# Patient Record
Sex: Female | Born: 1975 | Race: Black or African American | Hispanic: No
Health system: Midwestern US, Academic
[De-identification: ages and names within clinical notes are randomized; demographics above are authoritative.]

---

## 2016-12-25 ENCOUNTER — Emergency Department: Admit: 2016-12-25 | Discharge: 2016-12-25 | Disposition: A | Discharge status: Home / Self Care

## 2016-12-25 ENCOUNTER — Emergency Department: Admit: 2016-12-25 | Discharge: 2016-12-25 | Payer: Vision Other Private Insurance

## 2016-12-25 DIAGNOSIS — R109 Unspecified abdominal pain: ICD-10-CM

## 2016-12-25 DIAGNOSIS — R1084 Generalized abdominal pain: Principal | ICD-10-CM

## 2016-12-25 DIAGNOSIS — R112 Nausea with vomiting, unspecified: ICD-10-CM

## 2016-12-25 LAB — URINALYSIS DIPSTICK
Lab: NEGATIVE
Lab: NEGATIVE
Lab: NEGATIVE
Lab: NEGATIVE
Lab: NEGATIVE

## 2016-12-25 LAB — URINALYSIS, MICROSCOPIC

## 2016-12-25 LAB — COMPREHENSIVE METABOLIC PANEL
Lab: 0.3 mg/dL (ref 0.3–1.2)
Lab: 0.6 mg/dL (ref 0.4–1.00)
Lab: 133 MMOL/L — ABNORMAL LOW (ref 137–147)
Lab: 23 U/L (ref 7–40)
Lab: 25 MMOL/L (ref 21–30)
Lab: 3.1 MMOL/L — ABNORMAL LOW (ref 3.5–5.1)
Lab: 4 g/dL — ABNORMAL LOW (ref 3.5–5.0)
Lab: 60 U/L — ABNORMAL HIGH (ref 25–110)
Lab: 7 mg/dL (ref 7–25)
Lab: 8 g/dL (ref 6.0–8.0)
Lab: 88 mg/dL (ref 70–100)
Lab: 9 K/UL (ref 3–12)
Lab: 9.3 mg/dL (ref 8.5–10.6)
Lab: 99 MMOL/L — ABNORMAL LOW (ref 98–110)
Lab: >60 mL/min (ref >60)
Lab: >60 mL/min (ref >60)

## 2016-12-25 LAB — TROPONIN-I

## 2016-12-25 LAB — CBC AND DIFF
Lab: 0 10*3/uL (ref 0–0.20)
Lab: 0.1 10*3/uL (ref 0–0.45)
Lab: 6.7 10*3/uL (ref 4.5–11.0)

## 2016-12-25 LAB — POC LACTATE: Lab: 1.7 MMOL/L (ref 0.5–2.0)

## 2016-12-25 LAB — LIPASE: Lab: 27 U/L (ref 11–82)

## 2016-12-25 MED ORDER — ONDANSETRON 4 MG PO TBDI
4 mg | ORAL_TABLET | ORAL | 0 refills | 8.00000 days | Status: AC | PRN
Start: 2016-12-25 — End: 2017-04-26

## 2016-12-25 MED ORDER — ONDANSETRON HCL (PF) 4 MG/2 ML IJ SOLN
4 mg | Freq: Once | INTRAVENOUS | 0 refills | Status: CP
Start: 2016-12-25 — End: ?

## 2016-12-25 MED ORDER — LACTATED RINGERS IV SOLP
1000 mL | INTRAVENOUS | 0 refills | Status: CP
Start: 2016-12-25 — End: ?
  Administered 2016-12-25: 20:00:00 1000 mL via INTRAVENOUS

## 2016-12-25 NOTE — Response Teams
Rapid Response Team Progress Note    Date: 12/25/2016 Time: 1:21 PM  Patient: Cindy Snyder  Attending: No att. providers found Service: Emergency Medicine  Admission Date: 12/25/2016  LOS: 0 days    A Code/Rapid Response Timeline Event Report has been created for this patient on 12/25/16 at 1320. RRT called when patient had a sudden onset of abdominal pain and nausea. BP hypertensive at 173/110 otherwise VSS. While in route to emergency room patient began vomiting. Patient taken via wheelchair to ED 35 for further management.       Anthonette Legato, RN

## 2016-12-25 NOTE — ED Notes
Pt brought to the ED by RR team.  Pt was at work in [employer] when she experienced sudden onset of periumbilical abdominal pain 10/10, nausea, and shortly thereafter vomiting (approximately 468ml). On arrival to ED pt states that symptoms had improved significantly. Pt states that she had side pain yesterday and took a muscle relaxer. Denies alterations in I/O, fever, bleeding.    Belongings include ID, wallet with cards, staff shirt, pants, glasses. Belongings remain with pt. Pt does not wish to store belongings at this time.

## 2016-12-25 NOTE — ED Notes
Pt verbalizes understanding of discharge instructions and has no further questions.

## 2016-12-31 ENCOUNTER — Emergency Department: Admit: 2016-12-31 | Discharge: 2016-12-31 | Discharge status: Against Medical Advice

## 2017-04-26 ENCOUNTER — Encounter
Admit: 2017-04-26 | Discharge: 2017-04-26 | Payer: Vision Other Private Insurance | Primary: Student in an Organized Health Care Education/Training Program

## 2017-04-26 ENCOUNTER — Ambulatory Visit
Admit: 2017-04-26 | Discharge: 2017-04-26 | Payer: Commercial Managed Care - HMO | Primary: Student in an Organized Health Care Education/Training Program

## 2017-04-26 DIAGNOSIS — D219 Benign neoplasm of connective and other soft tissue, unspecified: ICD-10-CM

## 2017-04-26 DIAGNOSIS — N83201 Unspecified ovarian cyst, right side: ICD-10-CM

## 2017-04-26 DIAGNOSIS — N83209 Unspecified ovarian cyst, unspecified side: ICD-10-CM

## 2017-04-26 DIAGNOSIS — K5901 Slow transit constipation: ICD-10-CM

## 2017-04-26 DIAGNOSIS — R6889 Other general symptoms and signs: Principal | ICD-10-CM

## 2017-04-26 DIAGNOSIS — D649 Anemia, unspecified: ICD-10-CM

## 2017-04-26 DIAGNOSIS — Z7689 Persons encountering health services in other specified circumstances: Principal | ICD-10-CM

## 2017-04-26 LAB — CBC AND DIFF
Lab: 0 % (ref >60)
Lab: 0 10*3/uL (ref 0–0.20)
Lab: 0.1 10*3/uL (ref 0–0.45)
Lab: 0.7 10*3/uL (ref 0–0.80)
Lab: 10 g/dL — ABNORMAL LOW (ref 12.0–15.0)
Lab: 13 % — ABNORMAL HIGH (ref 4–12)
Lab: 16 % — ABNORMAL HIGH (ref 11–15)
Lab: 2 % (ref >60)
Lab: 2.2 10*3/uL (ref 1.8–7.0)
Lab: 2.6 10*3/uL (ref 1.0–4.8)
Lab: 24 pg — ABNORMAL LOW (ref 26–34)
Lab: 267 K/UL (ref 150–400)
Lab: 31 g/dL — ABNORMAL LOW (ref 32.0–36.0)
Lab: 33 % — ABNORMAL LOW (ref 36–45)
Lab: 38 % — ABNORMAL LOW (ref 41–77)
Lab: 4.2 M/UL (ref 4.0–5.0)
Lab: 47 % — ABNORMAL HIGH (ref 24–44)
Lab: 5.7 10*3/uL (ref 4.5–11.0)
Lab: 7.8 FL (ref 7–11)
Lab: 77 FL — ABNORMAL LOW (ref 80–100)

## 2017-04-26 LAB — COMPREHENSIVE METABOLIC PANEL
Lab: 136 MMOL/L — ABNORMAL LOW (ref >40)
Lab: 3.8 MMOL/L — ABNORMAL HIGH (ref <100)

## 2017-04-26 LAB — TSH WITH FREE T4 REFLEX: Lab: 1.3 uU/mL (ref 0.35–5.00)

## 2017-04-26 LAB — LIPID PROFILE
Lab: 140 mg/dL (ref <150)
Lab: 235 mg/dL — ABNORMAL HIGH (ref <200)

## 2017-04-26 MED ORDER — SENNOSIDES-DOCUSATE SODIUM 8.6-50 MG PO TAB
1 | ORAL_TABLET | Freq: Every day | ORAL | 0 refills | Status: AC
Start: 2017-04-26 — End: 2017-06-09

## 2017-05-19 ENCOUNTER — Ambulatory Visit
Admit: 2017-05-19 | Discharge: 2017-05-19 | Payer: Commercial Managed Care - HMO | Primary: Student in an Organized Health Care Education/Training Program

## 2017-05-19 ENCOUNTER — Encounter
Admit: 2017-05-19 | Discharge: 2017-05-19 | Payer: Vision Other Private Insurance | Primary: Student in an Organized Health Care Education/Training Program

## 2017-05-19 ENCOUNTER — Encounter
Admit: 2017-05-19 | Discharge: 2017-05-19 | Payer: Commercial Managed Care - HMO | Primary: Student in an Organized Health Care Education/Training Program

## 2017-05-19 DIAGNOSIS — D219 Benign neoplasm of connective and other soft tissue, unspecified: ICD-10-CM

## 2017-05-19 DIAGNOSIS — N898 Other specified noninflammatory disorders of vagina: ICD-10-CM

## 2017-05-19 DIAGNOSIS — Z30013 Encounter for initial prescription of injectable contraceptive: ICD-10-CM

## 2017-05-19 DIAGNOSIS — N946 Dysmenorrhea, unspecified: ICD-10-CM

## 2017-05-19 DIAGNOSIS — D259 Leiomyoma of uterus, unspecified: Principal | ICD-10-CM

## 2017-05-19 DIAGNOSIS — R6889 Other general symptoms and signs: Principal | ICD-10-CM

## 2017-05-19 DIAGNOSIS — N83209 Unspecified ovarian cyst, unspecified side: ICD-10-CM

## 2017-05-19 LAB — DIRECT EXAM (WET PREP)

## 2017-05-19 MED ORDER — MEDROXYPROGESTERONE 150 MG/ML IM SYRG
150 mg | Freq: Once | INTRAMUSCULAR | 0 refills | Status: CP
Start: 2017-05-19 — End: ?

## 2017-05-21 ENCOUNTER — Encounter
Admit: 2017-05-21 | Discharge: 2017-05-21 | Payer: Vision Other Private Insurance | Primary: Student in an Organized Health Care Education/Training Program

## 2017-05-21 DIAGNOSIS — N760 Acute vaginitis: Principal | ICD-10-CM

## 2017-05-21 MED ORDER — METRONIDAZOLE 500 MG PO TAB
500 mg | ORAL_TABLET | Freq: Two times a day (BID) | ORAL | 0 refills | Status: AC
Start: 2017-05-21 — End: ?

## 2017-06-03 ENCOUNTER — Ambulatory Visit
Admit: 2017-06-03 | Discharge: 2017-06-04 | Payer: BC Managed Care – PPO | Primary: Student in an Organized Health Care Education/Training Program

## 2017-06-03 DIAGNOSIS — D259 Leiomyoma of uterus, unspecified: Principal | ICD-10-CM

## 2017-06-03 DIAGNOSIS — N946 Dysmenorrhea, unspecified: ICD-10-CM

## 2017-06-09 ENCOUNTER — Ambulatory Visit
Admit: 2017-06-09 | Discharge: 2017-06-09 | Payer: BC Managed Care – PPO | Primary: Student in an Organized Health Care Education/Training Program

## 2017-06-09 ENCOUNTER — Encounter
Admit: 2017-06-09 | Discharge: 2017-06-09 | Payer: Vision Other Private Insurance | Primary: Student in an Organized Health Care Education/Training Program

## 2017-06-09 DIAGNOSIS — R6889 Other general symptoms and signs: Principal | ICD-10-CM

## 2017-06-09 DIAGNOSIS — D219 Benign neoplasm of connective and other soft tissue, unspecified: ICD-10-CM

## 2017-06-09 DIAGNOSIS — N946 Dysmenorrhea, unspecified: ICD-10-CM

## 2017-06-09 DIAGNOSIS — D259 Leiomyoma of uterus, unspecified: Principal | ICD-10-CM

## 2017-06-09 DIAGNOSIS — N83209 Unspecified ovarian cyst, unspecified side: ICD-10-CM

## 2017-07-29 ENCOUNTER — Encounter
Admit: 2017-07-29 | Discharge: 2017-07-29 | Payer: Vision Other Private Insurance | Primary: Student in an Organized Health Care Education/Training Program

## 2017-07-29 ENCOUNTER — Encounter
Admit: 2017-07-29 | Discharge: 2017-07-29 | Payer: PRIVATE HEALTH INSURANCE | Primary: Student in an Organized Health Care Education/Training Program

## 2017-08-09 ENCOUNTER — Ambulatory Visit
Admit: 2017-08-09 | Discharge: 2017-08-10 | Payer: BC Managed Care – PPO | Primary: Student in an Organized Health Care Education/Training Program

## 2017-08-09 ENCOUNTER — Encounter
Admit: 2017-08-09 | Discharge: 2017-08-09 | Payer: Vision Other Private Insurance | Primary: Student in an Organized Health Care Education/Training Program

## 2017-08-09 DIAGNOSIS — L0291 Cutaneous abscess, unspecified: Principal | ICD-10-CM

## 2017-08-09 DIAGNOSIS — N83209 Unspecified ovarian cyst, unspecified side: ICD-10-CM

## 2017-08-09 DIAGNOSIS — R6889 Other general symptoms and signs: Principal | ICD-10-CM

## 2017-08-09 DIAGNOSIS — D219 Benign neoplasm of connective and other soft tissue, unspecified: ICD-10-CM

## 2017-08-09 MED ORDER — CLINDAMYCIN HCL 300 MG PO CAP
300 mg | ORAL_CAPSULE | ORAL | 0 refills | Status: AC
Start: 2017-08-09 — End: 2018-01-24

## 2017-08-11 ENCOUNTER — Ambulatory Visit
Admit: 2017-08-11 | Discharge: 2017-08-12 | Payer: BC Managed Care – PPO | Primary: Student in an Organized Health Care Education/Training Program

## 2017-08-11 DIAGNOSIS — IMO0001 Birth control: Principal | ICD-10-CM

## 2017-08-11 MED ORDER — MEDROXYPROGESTERONE 150 MG/ML IM SYRG
150 mg | Freq: Once | INTRAMUSCULAR | 0 refills | Status: CP
Start: 2017-08-11 — End: ?

## 2017-08-31 ENCOUNTER — Encounter
Admit: 2017-08-31 | Discharge: 2017-08-31 | Payer: Vision Other Private Insurance | Primary: Student in an Organized Health Care Education/Training Program

## 2017-08-31 DIAGNOSIS — Z1239 Encounter for other screening for malignant neoplasm of breast: Principal | ICD-10-CM

## 2017-09-27 ENCOUNTER — Encounter
Admit: 2017-09-27 | Discharge: 2017-09-27 | Payer: Vision Other Private Insurance | Primary: Student in an Organized Health Care Education/Training Program

## 2017-09-27 ENCOUNTER — Ambulatory Visit
Admit: 2017-09-27 | Discharge: 2017-09-27 | Payer: Vision Other Private Insurance | Primary: Student in an Organized Health Care Education/Training Program

## 2017-09-27 DIAGNOSIS — H52203 Unspecified astigmatism, bilateral: Principal | ICD-10-CM

## 2017-09-27 DIAGNOSIS — R6889 Other general symptoms and signs: Principal | ICD-10-CM

## 2017-09-27 DIAGNOSIS — D219 Benign neoplasm of connective and other soft tissue, unspecified: ICD-10-CM

## 2017-09-27 DIAGNOSIS — N83209 Unspecified ovarian cyst, unspecified side: ICD-10-CM

## 2017-10-26 ENCOUNTER — Encounter
Admit: 2017-10-26 | Discharge: 2017-10-26 | Payer: Vision Other Private Insurance | Primary: Student in an Organized Health Care Education/Training Program

## 2017-10-28 ENCOUNTER — Encounter
Admit: 2017-10-28 | Discharge: 2017-10-28 | Payer: Vision Other Private Insurance | Primary: Student in an Organized Health Care Education/Training Program

## 2017-11-02 ENCOUNTER — Encounter
Admit: 2017-11-02 | Discharge: 2017-11-02 | Payer: Vision Other Private Insurance | Primary: Student in an Organized Health Care Education/Training Program

## 2017-11-02 ENCOUNTER — Ambulatory Visit
Admit: 2017-11-02 | Discharge: 2017-11-03 | Payer: BC Managed Care – PPO | Primary: Student in an Organized Health Care Education/Training Program

## 2017-11-02 DIAGNOSIS — Z3042 Encounter for surveillance of injectable contraceptive: Principal | ICD-10-CM

## 2017-11-02 MED ORDER — MEDROXYPROGESTERONE 150 MG/ML IM SYRG
150 mg | Freq: Once | INTRAMUSCULAR | 0 refills | Status: CP
Start: 2017-11-02 — End: ?
  Administered 2017-11-02: 19:00:00 150 mg via INTRAMUSCULAR

## 2018-01-20 ENCOUNTER — Ambulatory Visit
Admit: 2018-01-20 | Discharge: 2018-01-21 | Payer: BC Managed Care – PPO | Primary: Student in an Organized Health Care Education/Training Program

## 2018-01-20 ENCOUNTER — Encounter
Admit: 2018-01-20 | Discharge: 2018-01-20 | Payer: Vision Other Private Insurance | Primary: Student in an Organized Health Care Education/Training Program

## 2018-01-20 DIAGNOSIS — D219 Benign neoplasm of connective and other soft tissue, unspecified: ICD-10-CM

## 2018-01-20 DIAGNOSIS — N83209 Unspecified ovarian cyst, unspecified side: ICD-10-CM

## 2018-01-20 DIAGNOSIS — R6889 Other general symptoms and signs: Principal | ICD-10-CM

## 2018-01-20 MED ORDER — MEDROXYPROGESTERONE 150 MG/ML IM SYRG
150 mg | Freq: Once | INTRAMUSCULAR | 0 refills | Status: CP
Start: 2018-01-20 — End: ?
  Administered 2018-01-20: 20:00:00 150 mg via INTRAMUSCULAR

## 2018-01-21 DIAGNOSIS — Z3042 Encounter for surveillance of injectable contraceptive: Principal | ICD-10-CM

## 2018-01-21 DIAGNOSIS — Z01419 Encounter for gynecological examination (general) (routine) without abnormal findings: ICD-10-CM

## 2018-01-24 ENCOUNTER — Encounter
Admit: 2018-01-24 | Discharge: 2018-01-24 | Payer: Vision Other Private Insurance | Primary: Student in an Organized Health Care Education/Training Program

## 2018-01-24 ENCOUNTER — Emergency Department
Admit: 2018-01-24 | Discharge: 2018-01-24 | Disposition: A | Discharge status: Home / Self Care | Payer: BC Managed Care – PPO

## 2018-01-24 ENCOUNTER — Encounter
Admit: 2018-01-24 | Discharge: 2018-01-24 | Payer: PRIVATE HEALTH INSURANCE | Primary: Student in an Organized Health Care Education/Training Program

## 2018-01-24 DIAGNOSIS — D219 Benign neoplasm of connective and other soft tissue, unspecified: ICD-10-CM

## 2018-01-24 DIAGNOSIS — H81399 Other peripheral vertigo, unspecified ear: Principal | ICD-10-CM

## 2018-01-24 DIAGNOSIS — N83209 Unspecified ovarian cyst, unspecified side: ICD-10-CM

## 2018-01-24 DIAGNOSIS — R6889 Other general symptoms and signs: Principal | ICD-10-CM

## 2018-01-24 LAB — POC TROPONIN

## 2018-01-24 MED ORDER — MECLIZINE 25 MG PO TAB
25 mg | ORAL_TABLET | Freq: Three times a day (TID) | ORAL | 0 refills | 10.00000 days | Status: AC | PRN
Start: 2018-01-24 — End: 2019-05-17

## 2018-01-26 ENCOUNTER — Encounter
Admit: 2018-01-26 | Discharge: 2018-01-26 | Payer: Vision Other Private Insurance | Primary: Student in an Organized Health Care Education/Training Program

## 2018-01-26 DIAGNOSIS — R6889 Other general symptoms and signs: Principal | ICD-10-CM

## 2018-01-26 DIAGNOSIS — D219 Benign neoplasm of connective and other soft tissue, unspecified: ICD-10-CM

## 2018-01-26 DIAGNOSIS — N83209 Unspecified ovarian cyst, unspecified side: ICD-10-CM

## 2018-02-07 ENCOUNTER — Encounter
Admit: 2018-02-07 | Discharge: 2018-02-07 | Payer: Vision Other Private Insurance | Primary: Student in an Organized Health Care Education/Training Program

## 2018-02-27 ENCOUNTER — Encounter
Admit: 2018-02-27 | Discharge: 2018-02-27 | Payer: Vision Other Private Insurance | Primary: Student in an Organized Health Care Education/Training Program

## 2018-04-18 ENCOUNTER — Encounter
Admit: 2018-04-18 | Discharge: 2018-04-18 | Payer: Vision Other Private Insurance | Primary: Student in an Organized Health Care Education/Training Program

## 2018-04-18 ENCOUNTER — Ambulatory Visit
Admit: 2018-04-18 | Discharge: 2018-04-19 | Payer: BC Managed Care – PPO | Primary: Student in an Organized Health Care Education/Training Program

## 2018-04-18 MED ORDER — MEDROXYPROGESTERONE 150 MG/ML IM SYRG
150 mg | Freq: Once | INTRAMUSCULAR | 0 refills | Status: CP
Start: 2018-04-18 — End: ?
  Administered 2018-04-18: 20:00:00 150 mg via INTRAMUSCULAR

## 2018-04-19 DIAGNOSIS — Z3042 Encounter for surveillance of injectable contraceptive: Principal | ICD-10-CM

## 2018-07-04 ENCOUNTER — Encounter
Admit: 2018-07-04 | Discharge: 2018-07-04 | Payer: Vision Other Private Insurance | Primary: Student in an Organized Health Care Education/Training Program

## 2018-07-04 NOTE — Progress Notes
Called and updated patient with the new visitor policy via mobile number. Pt stated she understood and had no questions. Reminded patient to call the nurses line with any upcoming questions at [phone].

## 2018-07-05 ENCOUNTER — Encounter
Admit: 2018-07-05 | Discharge: 2018-07-05 | Payer: Vision Other Private Insurance | Primary: Student in an Organized Health Care Education/Training Program

## 2018-07-05 ENCOUNTER — Ambulatory Visit
Admit: 2018-07-05 | Discharge: 2018-07-06 | Payer: BC Managed Care – PPO | Primary: Student in an Organized Health Care Education/Training Program

## 2018-07-05 MED ORDER — MEDROXYPROGESTERONE 150 MG/ML IM SYRG
150 mg | Freq: Once | INTRAMUSCULAR | 0 refills | Status: CP
Start: 2018-07-05 — End: ?
  Administered 2018-07-05: 19:00:00 150 mg via INTRAMUSCULAR

## 2018-07-06 DIAGNOSIS — Z3042 Encounter for surveillance of injectable contraceptive: Principal | ICD-10-CM

## 2018-09-22 ENCOUNTER — Encounter: Admit: 2018-09-22 | Discharge: 2018-09-22 | Primary: Student in an Organized Health Care Education/Training Program

## 2018-12-06 ENCOUNTER — Encounter
Admit: 2018-12-06 | Discharge: 2018-12-06 | Payer: No Typology Code available for payment source | Primary: Student in an Organized Health Care Education/Training Program

## 2018-12-06 ENCOUNTER — Ambulatory Visit
Admit: 2018-12-06 | Discharge: 2018-12-07 | Payer: No Typology Code available for payment source | Primary: Student in an Organized Health Care Education/Training Program

## 2018-12-06 ENCOUNTER — Ambulatory Visit
Admit: 2018-12-06 | Discharge: 2018-12-06 | Payer: No Typology Code available for payment source | Primary: Student in an Organized Health Care Education/Training Program

## 2018-12-06 DIAGNOSIS — R6889 Other general symptoms and signs: Secondary | ICD-10-CM

## 2018-12-06 DIAGNOSIS — H52203 Unspecified astigmatism, bilateral: Secondary | ICD-10-CM

## 2018-12-06 DIAGNOSIS — D219 Benign neoplasm of connective and other soft tissue, unspecified: Secondary | ICD-10-CM

## 2018-12-06 DIAGNOSIS — N83209 Unspecified ovarian cyst, unspecified side: Secondary | ICD-10-CM

## 2018-12-06 NOTE — Assessment & Plan Note
Updated glasses prescription given at today's exam  Discussed emerging presbyopia, remains asymptomatic today

## 2018-12-06 NOTE — Progress Notes
Body mass index is 29.95 kg/m.              Assessment and Plan:    Problem   Myopic Astigmatism of Both Eyes       Myopic astigmatism of both eyes  Updated glasses prescription given at today's exam  Discussed emerging presbyopia, remains asymptomatic today          Interest in CLs, ok to return for CL exam only        Next Optometry visit -- prn or 1 yr  Test  Comments   MRx  _0     Galilei  _1     OCT  _2  Mac  _3  Nerve  _4  Ant seg    TOSM _5     Schirmer  _6  1 Drop of Proparacaine   _7  No numbing drops    IOP  _8  Tono   _9  iCare   _10  Applanate    Pachy  _11     HVF  _12  Sita Fast  _13  24-2       _14  Standard  _15  10-2         Optos Photos  _16         Dilate  _17 1 drop Tropicamide 1% &  Phenylephrine 2.5%   _18  Other (see comments)

## 2019-01-18 ENCOUNTER — Encounter
Admit: 2019-01-18 | Discharge: 2019-01-18 | Payer: No Typology Code available for payment source | Primary: Student in an Organized Health Care Education/Training Program

## 2019-01-20 ENCOUNTER — Encounter
Admit: 2019-01-20 | Discharge: 2019-01-20 | Payer: No Typology Code available for payment source | Primary: Student in an Organized Health Care Education/Training Program

## 2019-02-28 ENCOUNTER — Encounter
Admit: 2019-02-28 | Discharge: 2019-02-28 | Payer: No Typology Code available for payment source | Primary: Student in an Organized Health Care Education/Training Program

## 2019-04-17 ENCOUNTER — Encounter
Admit: 2019-04-17 | Discharge: 2019-04-17 | Payer: No Typology Code available for payment source | Primary: Student in an Organized Health Care Education/Training Program

## 2019-04-17 ENCOUNTER — Ambulatory Visit
Admit: 2019-04-17 | Discharge: 2019-04-18 | Payer: Private Health Insurance - Indemnity | Primary: Student in an Organized Health Care Education/Training Program

## 2019-04-17 DIAGNOSIS — Z124 Encounter for screening for malignant neoplasm of cervix: Secondary | ICD-10-CM

## 2019-04-17 DIAGNOSIS — D219 Benign neoplasm of connective and other soft tissue, unspecified: Secondary | ICD-10-CM

## 2019-04-17 DIAGNOSIS — Z01419 Encounter for gynecological examination (general) (routine) without abnormal findings: Principal | ICD-10-CM

## 2019-04-17 DIAGNOSIS — R6889 Other general symptoms and signs: Secondary | ICD-10-CM

## 2019-04-17 DIAGNOSIS — N83209 Unspecified ovarian cyst, unspecified side: Secondary | ICD-10-CM

## 2019-04-17 DIAGNOSIS — Z1239 Encounter for other screening for malignant neoplasm of breast: Secondary | ICD-10-CM

## 2019-04-17 MED ORDER — MEDROXYPROGESTERONE 150 MG/ML IM SYRG
150 mg | Freq: Once | INTRAMUSCULAR | 0 refills | Status: CP
Start: 2019-04-17 — End: ?
  Administered 2019-04-17: 17:00:00 150 mg via INTRAMUSCULAR

## 2019-04-17 NOTE — Patient Instructions
General Instructions    For prompt and efficient communication, MyChart is preferred. Please sign up.    To Contact Me:   Please send a MyChart message to your doctor or call [phone] to reach your doctor's nurse team. Please only call once in a 24-hour period. Leave a voicemail for the nurse to respond.  Calls or messages received after 4pm will be returned the next business day.  To Have Medication Refilled:   Please use the MyChart Refill request or contact your pharmacy directly to request medication refills. Please allow 72 hours.  To Receive Your Test Results:  ? Please allow 2 business day for normal and abnormal labs to result in MyChart.  ? Please allow 4 days for other tests, including cardiac studies, blood bank, and radiology results that are not deemed as sensitive.  ? It can take up to 10 days for pathology, microbiology, and sensitive tests.  Results NOT released to MyChart:  ? Prenatal ultrasound scanned results in the OB high risk clinic  ? Genetic testing  ? Labs that are sent to outside facilities  Our Lab (Location, Hours, and Fasting Information)  Lab Location: the 1st floor of the Medical Office Building, directly to the left of the Information Desk.  Lab Hours: Monday 6:30 am-6 pm, Tuesday-Friday 7 am-6 pm, and Saturday 7 am-noon.   Fasting for Lab: For fasting labs, please:   Do not eat for at least 8-10 hours before having your labs drawn, drink plenty of water, and make sure to continue your medications as prescribed (unless otherwise specified).  Radiology is on the 2nd floor of the Medical Office Building.  To Schedule an Appointment:   Contact our schedulers at [phone] and select #1 to make an appointment. At this time, you will be encouraged to signed up to MyChart if you not active.     To Receive Appointment Reminders on Your Cell Phone:   Make sure we have your cell phone number, and text [facility] to [phone].  For Urgent Questions: For medical emergencies, call 911.  On nights, weekends or holidays, call the Hospital operator at [phone], and ask for the Ob/Gyn Physician on call or Certified Nurse Midwife on call.           We value your feedback and you may receive a survey about your experience with our office. If you had a favorable experience, the only positive survey results that we will receive are if we are rated in the 9 or 10 range out of 10 points.   For up to date information on the COVID-19 virus, visit the [facility] website. [email or web address]  ? General supportive care during cold and flu season and infection prevention reminders:    o Wash hands often with soap and water for at least 20 seconds   o Cover your mouth and nose   o Social distancing: try to maintain 6 feet between you and other people   o Stay home if sick and symptoms mild or manageable?  ? If you must be around people wear a mask    ? If you are having symptoms of a lower respiratory infection (cough, shortness of breath) and/or fever AND either traveled in last 30 days (internationally or to region of exposure) OR known exposure to patient with COVID19:     o Call your primary care provider for questions or health needs.   ? Tell your doctor about your recent travel and your symptoms  o In a medical emergency, call 911 or go to the nearest emergency room.

## 2019-04-17 NOTE — Progress Notes
Date of Service: 04/17/2019    Subjective:             Cindy Snyder is a 43 y.o. female.    History of Present Illness    Cindy Snyder is a 43 y.o. [phone] here today for Well Woman and Contraception (Depo Shot)  .     Pt reports stress incontinence that occurs daily with any coughing, jumping, etc. She reports that her mom had to have surgery due to a prolapse so she would like to get this looked at.  She would also like to start back on depo provera. Her last injection was in March. She has not had regular menses since then, did have a period this month. Denies unprotected intercourse in the past 2 weeks.     OB History   Gravida Para Term Preterm AB Living   9 9 9     10    SAB TAB Ectopic Multiple Live Births                  # Outcome Date GA Lbr Len/2nd Weight Sex Delivery Anes PTL Lv   9 Term            8 Term            7 Term            6 Term            5 Term            4 Term            3 Term            2 Term            1 Term                Menstrual History   ? Age at menarche 43 years old     ? Duration of menses 4-7 days     ? Length of Cycle 25-30 days    ? Menopause? No    ? Age at menopause NA         Patient's last menstrual period was 01/09/2018 (approximate).    Health Maintenance Screening  Pap smear: 01/2018- NILM, HR HPV +. Reports abnormal pap at age 10, normal since.   STDs: No  Current Contraception:  Condom  Sexual activity: yes  Sexual dysfunction: No  Menopause symptoms: No  Incontinence: Reports daily stress incontinence   Abuse: No  Depression: No  Mammogram: None  Annual exam with PCP: Unsure  Seat belt use: Always  Skin exposure: Sunscreen use  Dietary assessment: Healthy diet  Physical activity: Yes, daily  Immunization status  Tetanus: Unsure   Influenza: Up to date   Immunization History   Administered Date(s) Administered   ? FLU VACCINE >3YO 02/28/2007         Medical History:   Diagnosis Date   ? Fibroids    ? Ovarian cyst ? Unspecified problems with internal organs        History reviewed. No pertinent surgical history.    Family History   Problem Relation Age of Onset   ? High Cholesterol Mother    ? Hearing Loss Mother    ? Learning Disability Mother    ? Asthma Maternal Grandmother    ? Cancer Maternal Grandmother    ? Asthma Paternal Grandmother    ? Cancer Paternal Grandmother    ? Cancer Paternal  Grandfather    ? Asthma Brother    ? Arthritis Neg Hx    ? Bleeding Disorders Neg Hx    ? Cancer-Colon Neg Hx    ? Cancer-Breast Neg Hx    ? Cancer-Ovarian Neg Hx    ? Cancer-Uterine Neg Hx    ? Cleft Lip Neg Hx    ? Cervical Cancer Neg Hx    ? Cleft Palate Neg Hx    ? Diabetes Neg Hx    ? Heart Disease Neg Hx    ? Migraines Neg Hx    ? Miscarriages Neg Hx    ? Rashes/Skin Problems Neg Hx    ? Seizures Neg Hx    ? Thyroid Disease Neg Hx    ? VTE Neg Hx    ? Unknown to Patient Neg Hx    ? Blindness Neg Hx    ? Cataract Neg Hx    ? Glaucoma Neg Hx    ? Macular Degen Neg Hx    ? Retinal Detachment Neg Hx        Social History     Tobacco Use   ? Smoking status: Never Smoker   ? Smokeless tobacco: Never Used   Substance Use Topics   ? Alcohol use: No   ? Drug use: No       Allergies  Patient has no known allergies.        Review of Systems   Constitutional: Negative for fatigue, fever and unexpected weight change.   HENT: Negative for voice change.    Respiratory: Negative for cough and shortness of breath.    Cardiovascular: Negative for chest pain and leg swelling.   Gastrointestinal: Negative for abdominal pain, blood in stool, constipation, diarrhea, nausea and vomiting.   Genitourinary: Positive for urgency. Negative for difficulty urinating, dyspareunia, dysuria, enuresis, frequency, genital sores, hematuria, menstrual problem, pelvic pain, vaginal bleeding, vaginal discharge and vaginal pain.   Musculoskeletal: Negative for arthralgias and back pain.   Skin: Negative for rash. Allergic/Immunologic: Negative for environmental allergies, food allergies and immunocompromised state.   Neurological: Negative for light-headedness and headaches.   Hematological: Negative for adenopathy. Does not bruise/bleed easily.   Psychiatric/Behavioral: Negative for confusion. The patient is not nervous/anxious.          Objective:         ? ibuprofen (MOTRIN) 600 mg tablet Take 600 mg by mouth every 6 hours as needed for Pain. Take with food.   ? meclizine (ANTIVERT) 25 mg tablet Take one tablet by mouth three times daily as needed.   ? medroxyprogesterone acetate (DEPO-PROVERA IM) Inject  into the muscle.   ? meloxicam (MOBIC) 7.5 mg tablet Take 1 tablet by mouth three times daily.     Vitals:    04/17/19 1028   BP: 116/65   BP Source: Arm, Left Upper   Patient Position: Sitting   Pulse: 63   Temp: 36.9 ?C (98.4 ?F)   TempSrc: Oral   Weight: 84.5 kg (186 lb 3.2 oz)   Height: 165.1 cm (65)   PainSc: Zero     Body mass index is 30.99 kg/m?Marland Kitchen     Physical Exam  Exam conducted with a chaperone present.   HENT:      Head: Normocephalic and atraumatic.   Neck:      Musculoskeletal: Normal range of motion.   Cardiovascular:      Rate and Rhythm: Normal rate.   Pulmonary:      Effort:  Pulmonary effort is normal.   Chest:      Breasts:         Right: Normal.         Left: Normal.   Abdominal:      General: Abdomen is flat.      Palpations: Abdomen is soft.   Genitourinary:     General: Normal vulva.      Vagina: Normal.      Cervix: Normal.      Uterus: Normal.       Adnexa: Right adnexa normal and left adnexa normal.      Rectum: Normal.      Comments: Cystocele noted on exam   Skin:     General: Skin is warm and dry.   Neurological:      Mental Status: She is alert and oriented to person, place, and time.   Psychiatric:         Mood and Affect: Mood normal.         Behavior: Behavior normal.            Assessment and Plan:  Pap today  Mammogram- discussed importance of screening   BSA  Flu vaccine up to date Depo given today   Obtain TDAP and see IM   Referral to urogynecology   RTC 1 year and PRN      Beecher Mcardle, APRN-NP  Collaborating physician Dr. Junie Bame

## 2019-04-18 DIAGNOSIS — Z3009 Encounter for other general counseling and advice on contraception: Secondary | ICD-10-CM

## 2019-05-01 ENCOUNTER — Encounter
Admit: 2019-05-01 | Discharge: 2019-05-01 | Payer: No Typology Code available for payment source | Primary: Student in an Organized Health Care Education/Training Program

## 2019-05-01 NOTE — Telephone Encounter
Spoke with patient. Patient stated that she was needing to make an appointment for a Colpo due to an abnormal pap. Colpo scheduled for 06-06-19 at 145 pm at the [facility]. Patient verbalized understanding

## 2019-05-10 ENCOUNTER — Encounter

## 2019-05-10 DIAGNOSIS — G4452 New daily persistent headache (NDPH): Secondary | ICD-10-CM

## 2019-05-10 DIAGNOSIS — R1110 Vomiting, unspecified: Secondary | ICD-10-CM

## 2019-05-10 DIAGNOSIS — R109 Unspecified abdominal pain: Secondary | ICD-10-CM

## 2019-05-10 DIAGNOSIS — Z20822 Encounter for screening laboratory testing for COVID-19 virus: Secondary | ICD-10-CM

## 2019-05-10 DIAGNOSIS — R6883 Chills (without fever): Secondary | ICD-10-CM

## 2019-05-10 DIAGNOSIS — R197 Diarrhea, unspecified: Secondary | ICD-10-CM

## 2019-05-10 NOTE — Patient Instructions
Coronavirus Disease 2019 (COVID-19): Overview  Coronavirus disease 2019 (COVID-19) is a respiratory illness. It's caused by a new (novel) coronavirus. There are many types of coronavirus. Coronaviruses are a very common cause of colds and bronchitis. They may sometimes cause lung infection (pneumonia). Symptoms can range from mild to severe. Some people have no symptoms.?These viruses are also found in some animals.   All 50 states in the [place] have reported cases of COVID-19. Most states report community spread of COVID-19. This means the source of the illness is not known.?COVID-19 is a rapidly-emerging infectious disease. This means that scientists are actively researching it.?There are information updates regularly.   Public health officials are working to find the source. How the virus spreads is not yet fully understood, but it seems to spread and infect people fairly easily. Some people who have been infected in an area may not be sure how or where they were infected. The virus may be spread through droplets of fluid that a person coughs or sneezes into the air. It may be spread if you touch a surface with the virus on it, such as a handle or object, and then touch your eyes, nose, or mouth.   For the latest information, visit the CDC website at www.cdc.gov/coronavirus/2019-ncov. Or call 800-CDC-INFO (800-232-4636).     What are the symptoms of COVID-19?  Some people have no symptoms or mild symptoms. Symptoms can also vary from person to person. As experts learn more about COVID-19, other symptoms are being reported. Symptoms may appear 2 to 14 days after contact with the virus:   ? Fever or chills  ? Coughing  ? Trouble breathing or feeling short of breath  ? Sore throat  ? Stuffy or runny nose  ? Headache and body aches  ? Fatigue  ? Nausea, vomiting, diarrhea, or abdominal pain  ? New loss of sense of smell or taste  You can check your symptoms with the CDC?s Coronavirus Self-Checker. What are possible complications from COVID-19?  In many cases, this virus can cause infection (pneumonia) in both lungs. In some cases, this can cause death. Certain people are at higher risk for complications. This includes older adults and people with serious chronic health conditions such as heart or lung disease, diabetes, or kidney disease. It includes people with health conditions that suppress the immune system. And it includes people taking medicines that suppress the immune system.   As experts learn more about COVID-19, other complications are being reported that may be linked to COVID-19. Rarely, some children have developed severe complications called multisystem inflammatory syndrome in children (MIS-C). MIS-C seems to be similar to [place] disease, a rare condition causing inflammation of blood vessels and body organs. It's not yet known if MIS-C happens only in children, or if adults are also at risk. It's also not known if it's related to COVID-19, because many children, but not all, have tested positive for the virus. Experts continue to study MIS-C. The CDC advises healthcare providers to report to local health departments any person under age 21 years old who is ill enough to be in the hospital and has all of the following:   ? A fever over 100.4?F (38.0?C) for more than 24 hours and a positive SARS-CoV-2 test or exposure to the virus in the last 4 weeks   ? Inflammation in at least 2 organs such as the heart, lungs, or kidneys with lab tests that show inflammation   ? No other diagnoses besides COVID-19   explain the child's symptoms     How is COVID-19 diagnosed? Your healthcare provider will ask about your symptoms. He or she will ask where you live, and about your recent travel, and any contact with sick people. If your healthcare provider thinks you may have COVID-19, he or she will consider whether to test you for COVID-19. This depends on the availability of testing in your area, and how sick you are. Follow all instructions from your healthcare provider. Guidelines for testing may change as more information about the virus becomes available. Currently, COVID-19 is diagnosed by:   ? Viral test.  Viral tests tell if you have a current COVID-19 infection. A nose-throat swab may be wiped inside your nose to the back of your throat. Or a sample of your saliva may be taken. Either of these samples will be checked for the SARS-CoV-2 virus. Availability of tests vary. Some test kits can be done at home. But both nose-throat swabs and saliva tests must be sent to a lab to be looked at.   If your healthcare provider thinks or confirms that you have COVID-19, you may have other tests. These tests may include:   ? Antibody blood test.  Antibody tests are being looked at to find out if a person has previously been infected with the virus and may now have antibodies such as SARS AB IgG in their blood to give some immunity. The accuracy and availability of antibody tests vary. An antibody test may not be able to show if you have a current infection because it can take up to a few weeks after infection to make antibodies. It's not yet known how long immunity lasts after being infected with the virus.   ? Sputum culture.  A small sample of mucus coughed from your lungs (sputum) may be collected if you have a moist cough. It may be checked for the virus or to look for pneumonia.   ? Imaging tests.  You may have a chest X-ray or CT scan.       Note about reinfection and your immunity At this time, it's unclear if people can be reinfected with COVID-19. The CDC notes that if a person has fully recovered from COVID-19 and is retested within 3 months of the first infection, they may continue to have low levels of the virus in their body and test positive for COVID-19, even though they are not spreading COVID-19. Having a positive COVID-19 test after an infection doesn't mean you can't be reinfected. It's not yet known how long immunity lasts after being infected with the virus.   How is COVID-19 treated?  There is currently no medicine proven to prevent or treat the virus. Some experimental medicines are being tested for COVID-19. Other medicines used to treat other conditions are being looked at for COVID-19, but these are not currently approved to treat it.   The most proven treatments right now are those to help your body while it fights the virus. This is known as supportive care. Supportive care may include:   ? Getting rest.  This helps your body fight the illness.   ? Staying hydrated.  Drinking liquids is the best way to prevent dehydration.. Try to drink 6 to 8 glasses of liquids every day, or as advised by your provider. Also check with your provider about which fluids are best for you. Don't drink fluids that contain caffeine or alcohol.   ? Taking over-the-counter (OTC)?pain medicine.  These are used  to help ease pain and reduce fever. Follow your healthcare provider's instructions for which OTC medicine to use.   For severe illness, you may need to stay in the hospital. Care during severe illness may include:   ? IV (intravenous) fluids.  These are given through a vein to help keep your body hydrated.   ? Oxygen. You may be given supplemental oxygen or ventilation with a breathing machine (ventilator). This is done so you get enough oxygen in your body. ? Prone positioning.  Depending on how sick you are during your hospital stay, your healthcare team may turn you regularly on your stomach. This is called prone positioning. It helps increase the amount of oxygen you get to your lungs. Follow your healthcare team's instructions on position changes while you're in the hospital. Also follow their discharge advice on the best positions to help your breathing once you go home.   People who have had COVID-19 and are fully recovered may be asked by their healthcare team to consider donating plasma. This is called COVID-19 convalescent plasma donation. Plasma from people fully recovered from COVID-19 may contain antibodies to help fight COVID-19 in people who are currently seriously ill with the disease. Experts don't know if the donated plasma will work well as a treatment. Research continues, and the FDA has approved it for emergency use in certain people with serious or life-threatening COVID-19. Talk with your provider to learn more about convalescent plasma donation and whether you qualify to donate.       Are you at risk for COVID-19?  You are at risk for COVID-19 if you have had close contact with someone with the virus, or if you live in or traveled to an area with cases of it. Close contact means being within about 6 feet of someone, or living in the same house or visiting a person who has or may have COVID-19. Some recent studies suggest that COVID-19 may be spread by people who are not showing symptoms.     Preventing the Spread of Infection    ?     Hand Hygiene    The best way to prevent the spread of infections is to wash your hands or use hand sanitizer. Staff will clean hands between tasks and upon entering and exiting your hospital room. ?   For patients and visitors:    Clean hands frequently, upon entering and exiting a room, and after coughing and sneezing.    When washing hands with soap and water:   ? Wet hands with warm water  ? Apply soap ? Lather soap by rubbing hands together for 20 seconds, covering all surfaces of hands and fingers  ? Rinse hands thoroughly  ? Dry hands with paper towel  ? Use a towel to turn off faucet  ?  When cleaning hands with hand sanitizer:   ? Apply hand sanitizer to hands  ? Rub on hands covering of hands and fingers until dry (about 15 to 20 seconds) ?      Coronavirus Disease 2019 (COVID-19): Caring for Yourself or Others   If you or a household member have symptoms of COVID-19, follow the guidelines below for preventing spread of the virus, and managing symptoms.     If you think you have COVID-19 symptoms  ? Stay home. Call your healthcare provider and tell them you have symptoms of COVID-19. Do this before going to any hospital or clinic. Follow your provider's instructions. You may be advised to  isolate yourself at home. This is called self-isolation.   ? Don?t panic. Keep in mind that other illnesses can cause similar symptoms.   ? Stay away from work, school, and public places. Limit physical contact with family members. Limit visitors. Don't kiss anyone or share eating or drinking utensils. Clean surfaces you touch with disinfectant. This is to help prevent the virus from spreading.   ? If you need to cough or sneeze, do it into a tissue. Then throw the tissue into the trash. If you don't have tissues, cough or sneeze into the bend of your elbow.   ? Don?t share food or personal items with people in your household. This includes items like eating and drinking utensils, towels, and bedding.   ? Wear a cloth face mask around other people. During a public health emergency, medical face masks may be reserved for healthcare workers. You may need to make a cloth face mask of your own. You can do this using a bandana, T-shirt, or other cloth. The CDC has instructions on how to make a face mask. Wear the mask so that it covers both your nose and mouth. ? If you need to go to a hospital or clinic, expect that the healthcare staff will wear protective equipment such as masks, gowns, gloves, and eye protection. You may be put in a separate room. This is to prevent the possible virus from spreading.   ? Tell the healthcare staff about recent travel. This includes local travel on public transport. Staff may need to find other people you have been in contact with.   ? Follow all instructions the healthcare staff give you.    If you have been diagnosed with COVID-19  ? Stay home and start self-isolation. Don?t leave your home unless you need to get medical care. Don't go to work, school, or public areas. Don't use public transportation or taxis.   ? Follow all instructions from your healthcare provider. Call your healthcare provider?s office before going. They can prepare and give you instructions. This will help prevent the virus from spreading.   ? If you need to go to a hospital or clinic, expect that the healthcare staff will wear protective equipment such as masks, gowns, gloves, and eye protection. You may be put in a separate room. This is to prevent the possible virus from spreading.   ? Wear a face mask. This is to protect other people from your germs. If you are not able to wear a mask, your caregivers should. During a public health emergency, medical face masks may be reserved for healthcare workers. You may need to make a cloth face mask of your own. You can do this using a bandana, T-shirt, or other cloth. The CDC has instructions on how to make a face mask. Wear the mask so that it covers both your nose and mouth.   ? Stay away from other people in your home.  ? Have no contact with pets and animals.  ? Don?t share food or personal items with people in your household. This includes items like eating and drinking utensils, towels, and bedding. ? If you need to cough or sneeze, do it into a tissue. Then throw the tissue into the trash. If you don't have tissues, cough or sneeze into the bend of your elbow.   ? Wash your hands often.    Self-care at home?  There is currently no medicine approved to prevent or treat the virus. Some experimental and  other medicines are being tested against COVID-19. Other medicines used to treat other conditions are being looked at for COVID-19, but they are not currently approved to treat it.   Current treatment is mainly aimed at helping your body while it fights the virus. This is known as supportive care. Take care of yourself at home by:   ? Getting rest. This helps your body fight the illness.   ? Staying hydrated.  Drinking liquids is the best way to prevent dehydration. Try to drink 6 to 8 glasses of liquids every day, or as advised by your provider. Also check with your provider about which fluids are best for you. Don't drink fluids that contain caffeine or alcohol.   ? Taking over-the-counter (OTC) pain medicine. These are used to help ease pain and reduce fever. Follow your healthcare provider's instructions for which OTC medicine to use.   If you've been in the hospital for suspected or confirmed COVID-19 and now are home, follow all of your healthcare team's instructions. This will include when it's OK to stop self-isolation. You may also get instructions on position changes to help your breathing, such as lying on your belly (prone positioning). If you've had confirmed COVID-19, your healthcare team may ask you to consider donating your plasma. This is called COVID-19 convalescent plasma donation. Plasma from people fully recovered from COVID-19 may contain antibodies to help fight COVID-19 in people who are currently seriously ill with the disease. Experts don't know the safety of COVID-19 convalescent plasma or how well it works. Research continues. The FDA has approved it for emergency use in certain people with serious or life-threatening COVID-19.     Caring for a sick person?  ? Follow all instructions from healthcare staff.  ? Wash your hands often.  ? Wear protective clothing as advised.  ? Make sure the sick person wears a mask. If they can't wear a mask, don't stay in the same room with the person. If you must be in the same room, wear a face mask. When wearing a mask, make sure that it covers both the nose and mouth.   ? Keep track of the sick person?s symptoms.  ? Clean home surfaces often with disinfectant. This includes phones, kitchen counters, fridge door handle, bathroom surfaces, and others.   ? Don?t let anyone share household items with the sick person. This includes eating and drinking tools, towels, sheets, or blankets.   ? Clean fabrics and laundry thoroughly.  ? Keep other people and pets away from the sick person.    When you can stop self-isolation  When you are sick with COVID-19, you should stay away from other people. This is called self-isolation. Your limits are different if you've had COVID-19 in the last 3 months but are fully recovered without symptoms and you have been exposed to someone with COVID-19. If you are symptom-free, you don't need to stay home away from others or be retested. The CDC doesn't recommend retesting unless you have symptoms of COVID-19 and your new symptoms can't be linked to another illness. Contact your healthcare provider if you have any questions. If you develop symptoms, stay home. If you had COVID-19 over 3 months ago and have been exposed again, treat it like you've never had COVID-19 and stay home, limit your contact with others, call your provider, and monitor for symptoms.   If you are normally healthy, the CDC does not advise retesting for COVID-19 with nose-throat swabs. You can stop self-isolation  when all 3 of these are true:   1. You have had no fever for at least 24 hours. This means no fever without medicine that reduces fever, such as acetaminophen, for at least 24 hours.   2. Your symptoms such as cough or trouble breathing have improved.   3. It has been at least 10 days since your first symptoms started.   Talk with your healthcare provider before you leave home. Tell them if the 3 things above are true for you. They may tell you it?s OK to leave home. In some cases, your state or local area may have specific advice. Your healthcare provider will tell you more.?   If you have a weak immune system and COVID-19, or if you've had severe COVID-19,  your instructions on when to stop isolation will be somewhat different. Some conditions and treatments can cause a weak immune system. These include cancer treatment, bone marrow or organ transplants, and conditions such as HIV or other immune system disorders. You may be advised to stay home from 10 days to 20 days after your symptoms first started. Your healthcare provider may want to retest you for COVID-19. Follow your provider's instructions. When you return to public settings  When you are well enough to go outside your home, consider the CDC's guidance on cloth face masks:   ? The CDC advises all people over age 30 to wear cloth face masks in public settings when around people outside of their household, especially when it's hard to socially distance. For example, wear a face mask in populated places such as public transit, public protests and marches, and crowded stores, bars, and restaurants.   ? Cloth masks may help prevent people who have COVID-19 form spreading the virus to others.   ? Cloth masks are most likely to reduce COVID-19 spread when masks are widely used by people who are out in the public.   Certain people should not wear a face covering. This includes:   ? Children younger than 80 years old  ? Anyone with a health, developmental, or mental health condition that can be made worse by wearing a mask   ? Anyone who is unconscious or unable to remove the face covering without help. See the CDC's guidance on who should not wear a face mask.     When to call your healthcare provider  Call your healthcare provider right away if a sick person has any of these:   ? Trouble breathing  ? Pain or pressure in chest  If a sick person has any of these, call 911:  ? Trouble breathing that gets worse  ? Pain or pressure in chest that gets worse  ? Blue tint to lips or face  ? Fast or irregular heartbeat  ? Confusion or trouble waking  ? Fainting or loss of consciousness  ? Coughing up blood    Going home from the hospital   If you were diagnosed with COVID-19 and were recently discharged from the hospital:   ? Follow the instructions above for self-care and isolation.  ? Follow the hospital healthcare team?s specific instructions.   ? Ask questions if anything is unclear to you. Write down answers so you remember them.

## 2019-05-11 ENCOUNTER — Encounter
Admit: 2019-05-11 | Discharge: 2019-05-11 | Payer: No Typology Code available for payment source | Primary: Student in an Organized Health Care Education/Training Program

## 2019-05-11 LAB — COVID-19 (SARS-COV-2) PCR

## 2019-05-17 ENCOUNTER — Ambulatory Visit
Admit: 2019-05-17 | Discharge: 2019-05-17 | Payer: Private Health Insurance - Indemnity | Primary: Student in an Organized Health Care Education/Training Program

## 2019-05-17 ENCOUNTER — Encounter
Admit: 2019-05-17 | Discharge: 2019-05-17 | Payer: No Typology Code available for payment source | Primary: Student in an Organized Health Care Education/Training Program

## 2019-05-17 DIAGNOSIS — M25562 Pain in left knee: Secondary | ICD-10-CM

## 2019-05-17 DIAGNOSIS — N83209 Unspecified ovarian cyst, unspecified side: Secondary | ICD-10-CM

## 2019-05-17 DIAGNOSIS — D219 Benign neoplasm of connective and other soft tissue, unspecified: Secondary | ICD-10-CM

## 2019-05-17 DIAGNOSIS — R6889 Other general symptoms and signs: Secondary | ICD-10-CM

## 2019-05-17 MED ORDER — DICLOFENAC SODIUM 1 % TP GEL
4 g | Freq: Four times a day (QID) | TOPICAL | 3 refills | 25.00000 days | Status: AC
Start: 2019-05-17 — End: ?

## 2019-05-17 NOTE — Progress Notes
Attending Attestation (GE Modifier)  I have reviewed Cache's case with Calvert Cantor, MD  at the time of the visit.  I agree with assessment and plan of care as discussed.  Hyman Bible, MD

## 2019-05-17 NOTE — Progress Notes
Date of Service: 05/17/2019    Subjective:             Cindy Snyder is a 44 y.o. female who presents to clinic for evaluation of L leg pain.    History of Present Illness  Cindy Snyder states the pain started in the back of her knee about one month ago. The pain is hard to localize, but feels like a pulling sensation that extends from the back of the knee and goes up as high as middle of the back of the thigh. She states the pain is worse when walking up or down stairs, and particularly worse when trying to stand back up after being seated for an extended period of time, such as when driving or when at work. It's otherwise pretty constant however. She has had to miss at least one day of work in the last week due to the pain. In addition, she will take ibuprofen in evenings to provide some relief so she can fall asleep.    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 Since the change, she has been sitting for longer periods of time, and says she recently got a new desk/office chair. She mentions that she will notice exquisite pain after having sat in that chair and work and then attempting to walk around.          Review of Systems   Constitutional: Negative for chills, diaphoresis and fever.   Respiratory: Negative for shortness of breath.    Cardiovascular: Negative for chest pain.   Musculoskeletal: Positive for arthralgias, gait problem and myalgias. Negative for joint swelling.   Skin: Negative for color change, rash and wound.   Neurological: Negative for light-headedness.         Objective:         ? medroxyprogesterone acetate (DEPO-PROVERA IM) Inject  into the muscle.     Vitals:    05/17/19 0931   BP: 124/75   Pulse: 60   Resp: 16   Temp: 36.5 ?C (97.7 ?F)   TempSrc: Skin Weight: 83 kg (183 lb)   Height: 165.1 cm (65)   PainSc: Six     Body mass index is 30.45 kg/m?Marland Kitchen     Physical Exam  Constitutional:       General: She is not in acute distress.     Appearance: Normal appearance. She is not ill-appearing, toxic-appearing or diaphoretic.   HENT:      Head: Normocephalic and atraumatic.   Cardiovascular:      Rate and Rhythm: Normal rate and regular rhythm.      Heart sounds: Normal heart sounds.   Pulmonary:      Effort: Pulmonary effort is normal.      Breath sounds: Normal breath sounds.   Skin:     General: Skin is warm and dry.   Neurological:      Mental Status: She is alert.     Knee Musculoskeletal Exam    General      Neurological: alert    Gait      Antalgic: left    Inspection      Inspection - Left        Left knee inspection is normal.     Palpation  Palpation - Left        Left knee palpation is unremarkable.      Range of Motion      Range of Motion - Left        Left knee active extension: Left knee extension is full, but accompanied by a pulling pain extending up to posterior mid thight, both for active and passive ROM.    Strength      Strength - Left        Extension: 5/5      Flexion: 4/5      Flexion: affected by pain    Instability      Instability Signs - Left        Varus stress grade: normal      Valgus stress grade: normal      Anterior drawer: normal      Posterior drawer: normal      Medial McMurray test: negative      Lateral McMurray test: negative    Neurovascular      Neurovascular - Left        Left knee neurovascular exam is normal.             Assessment and Plan: Cindy Snyder. Cindy Snyder was seen today for L leg pain. The patient's subjective report includes pain of a puling quality that starts behind the left knee extends up to the mid posterior calf that is relatively constant in nature, but particularly exacerbated by walking up and down stairs and after being in a seated position after an extended period of time. On physical exam, there are no skin changes, swelling, warmth, or redness noticed in area where the pain is reported. In addition, there is pain on active and passive ROM with L knee extension, as well as pain on L knee flexion strength testing. No point tenderness was elicited on palpation, however.    The leading differential diagnoses for this patient are those of a Baker cyst or some sort of muscular/tendon sprain or inflammation. Plan is to obtain US to evaluate for soft tissue pathology.     Diagnoses and all orders for this visit:    Chronic pain of left knee  -     [place] EXTRM-ANATOM SPEC LEFT; Future; Expected date: 05/17/2019    Other orders  -     diclofenac (VOLTAREN) 1 % topical gel; Apply four g topically to affected area four times daily.        -     We counseled the patient on the benefits of a topical NSAID as compared to the long-term use of an oral agent.          Cindy Snyder, MS4    ATTESTATION    I personally performed or re-performed the history, physical exam and treatment plan for the E/M. I discussed the case with the Medical Student, and concur with the Medical Student documentation of history, physical exam and treatment plan unless otherwise noted.    Resident name:  Boykin Nearing, MD Date:  05/17/2019     Patient staffed with Dr. Kyung Rudd

## 2019-05-22 ENCOUNTER — Encounter
Admit: 2019-05-22 | Discharge: 2019-05-22 | Payer: No Typology Code available for payment source | Primary: Student in an Organized Health Care Education/Training Program

## 2019-05-25 ENCOUNTER — Encounter
Admit: 2019-05-25 | Discharge: 2019-05-25 | Payer: No Typology Code available for payment source | Primary: Student in an Organized Health Care Education/Training Program

## 2019-05-25 NOTE — Telephone Encounter
Andi Devon Key: [id number]  PA Case ID: [id number]  Rx #: [id number] Need help? Call us at [phone]  Outcome  Approvedtoday  [id number];Review Type:Prior Auth;Coverage Start Date:04/25/2019;Coverage End Date:05/24/2020;  Drug  Diclofenac Sodium 1% gel  Form  [employer] Electronic PA Form  Original Claim Info  75 CALL HELP DESKFOR EMRG OVD, SCC=13 PER RPH DISCRETION    Phone call to [facility] in [place] Spoke with barney. Approval given. pharmacy with notify patient.

## 2019-05-31 ENCOUNTER — Encounter
Admit: 2019-05-31 | Discharge: 2019-05-31 | Payer: No Typology Code available for payment source | Primary: Student in an Organized Health Care Education/Training Program

## 2019-05-31 ENCOUNTER — Emergency Department
Admit: 2019-05-31 | Discharge: 2019-05-31 | Disposition: A | Discharge status: Home / Self Care | Payer: Private Health Insurance - Indemnity

## 2019-05-31 DIAGNOSIS — N939 Abnormal uterine and vaginal bleeding, unspecified: Secondary | ICD-10-CM

## 2019-05-31 DIAGNOSIS — R6889 Other general symptoms and signs: Secondary | ICD-10-CM

## 2019-05-31 DIAGNOSIS — N83209 Unspecified ovarian cyst, unspecified side: Secondary | ICD-10-CM

## 2019-05-31 DIAGNOSIS — D219 Benign neoplasm of connective and other soft tissue, unspecified: Secondary | ICD-10-CM

## 2019-05-31 LAB — CHLAM/NG PCR SWAB
Lab: NEGATIVE
Lab: NEGATIVE

## 2019-05-31 LAB — CBC AND DIFF
Lab: 0 % (ref >60)
Lab: 0 10*3/uL (ref 0–0.20)
Lab: 0.1 10*3/uL (ref 0–0.45)
Lab: 0.5 10*3/uL (ref 0–0.80)
Lab: 1.9 10*3/uL (ref 1.8–7.0)
Lab: 11 % (ref 4–12)
Lab: 12 g/dL (ref 12.0–15.0)
Lab: 14 % (ref 11–15)
Lab: 2 % (ref >60)
Lab: 2.1 10*3/uL (ref 1.0–4.8)
Lab: 219 K/UL (ref 150–400)
Lab: 25 pg — ABNORMAL LOW (ref 26–34)
Lab: 32 g/dL (ref 32.0–36.0)
Lab: 38 % (ref 36–45)
Lab: 4.7 10*3/uL — ABNORMAL HIGH (ref 4.5–11.0)
Lab: 4.8 M/UL — ABNORMAL HIGH (ref 4.0–5.0)
Lab: 41 % (ref 41–77)
Lab: 46 % — ABNORMAL HIGH (ref 24–44)
Lab: 7.5 FL (ref 7–11)
Lab: 79 FL — ABNORMAL LOW (ref 80–100)
Lab: 18 (ref <20.7)

## 2019-05-31 LAB — COMPREHENSIVE METABOLIC PANEL
Lab: 141 MMOL/L — ABNORMAL HIGH (ref 137–147)
Lab: 3.7 MMOL/L (ref 3.5–5.1)

## 2019-05-31 LAB — DIRECT EXAM (WET PREP)

## 2019-05-31 NOTE — ED Notes
Pt A&Ox4 and given discharge instructions and follow up instructions.  Pt verbalizes understanding and has no further questions or concerns. VSS upon discharge and ambulated out of ER to ER exit. Pt to go home via PV.

## 2019-05-31 NOTE — ED Notes
At bedside with ER Resident and ER Attending for pelvic exam

## 2019-05-31 NOTE — ED Notes
Pt resting on right side with lights dimmed for comfort. VSS. No signs of distress. Pt appears comfortable. Sending cervical swabs at this time. Will continue to monitor. Bed in lowest locked position and call light within reach.

## 2019-06-02 ENCOUNTER — Encounter
Admit: 2019-06-02 | Discharge: 2019-06-02 | Payer: No Typology Code available for payment source | Primary: Student in an Organized Health Care Education/Training Program

## 2019-06-02 NOTE — Telephone Encounter
ED Discharge Follow Up  Reached patient: Yes  Admission Information  Hospital Name : [facility]  ED Admission Date: 05/31/19 ED Discharge Date: 05/31/19 Admission Diagnosis: Irregular menses  Discharge Diagnosis Abnormal vaginal bleeding  Hospital Services: Unplanned  Today's call is 2 (calendar) days post discharge    Discharge Instruction Review  Did patient receive and understand discharge instructions? Yes  Are there concerns regarding the patient?s ADL?s ? No    Medication Reconciliation  Changes to pre-ED visit medications? No  Were new prescriptions filled?N/A  Meds reviewed and reconciled? Yes  ? diclofenac (VOLTAREN) 1 % topical gel Apply four g topically to affected area four times daily.   ? medroxyprogesterone acetate (DEPO-PROVERA IM) Inject  into the muscle.         Understanding Condition  Having any current symptoms? Yes, vaginal bleeding she reports continued vaginal bleeding, but denies feeling dizzy/lightheaded at this time.  She is scheduled with OBGYN 06/06/2019, and with PCP 06/08/2019.  She denies any immediate needs.  Patient understands when to seek additional medical care? Yes  Is patient aware of [facility] locations?Yes  Urgent Care appropriate for this diagnosis? No  Other instructions provided:     Scheduling Follow-up Appointment  Upcoming appointment date and time and with whom scheduled:   Future Appointments   Date Time Provider Department Center   06/06/2019 10:45 AM [facility] - [facility] GENERAL ROOM [facility] Radiolog   06/06/2019  1:45 PM Abicht, Rolly Pancake, MD [facility]N OB/GYN   06/08/2019 10:00 AM Boykin Nearing, MD [facility] FM     When was patient?s last PCP visit: Visit date not found  PCP primary location: [facility]  PCP appointment scheduled? Yes, Date: 06/08/2019   Specialist appointment scheduled? Yes, with OB/GYN 06/06/2019  Both PCP and Specialist appointment scheduled: Yes  Is assistance with transportation needed? No    ED Communication   Did Pt call Clinic prior to going to ED? No  Reason patient went to ED: Fear of having a critical medical condition    Lavell Islam, [place]

## 2019-06-02 NOTE — Telephone Encounter
Noted.Will Follow As Needed.

## 2019-06-06 ENCOUNTER — Ambulatory Visit
Admit: 2019-06-06 | Discharge: 2019-06-06 | Payer: Private Health Insurance - Indemnity | Primary: Student in an Organized Health Care Education/Training Program

## 2019-06-06 ENCOUNTER — Ambulatory Visit
Admit: 2019-06-06 | Discharge: 2019-06-06 | Payer: No Typology Code available for payment source | Primary: Student in an Organized Health Care Education/Training Program

## 2019-06-06 ENCOUNTER — Encounter
Admit: 2019-06-06 | Discharge: 2019-06-06 | Payer: No Typology Code available for payment source | Primary: Student in an Organized Health Care Education/Training Program

## 2019-06-06 DIAGNOSIS — B977 Papillomavirus as the cause of diseases classified elsewhere: Secondary | ICD-10-CM

## 2019-06-06 DIAGNOSIS — N83209 Unspecified ovarian cyst, unspecified side: Secondary | ICD-10-CM

## 2019-06-06 DIAGNOSIS — D219 Benign neoplasm of connective and other soft tissue, unspecified: Secondary | ICD-10-CM

## 2019-06-06 DIAGNOSIS — N939 Abnormal uterine and vaginal bleeding, unspecified: Secondary | ICD-10-CM

## 2019-06-06 DIAGNOSIS — D649 Anemia, unspecified: Secondary | ICD-10-CM

## 2019-06-06 DIAGNOSIS — R6889 Other general symptoms and signs: Secondary | ICD-10-CM

## 2019-06-06 DIAGNOSIS — Z01812 Encounter for preprocedural laboratory examination: Secondary | ICD-10-CM

## 2019-06-06 NOTE — Progress Notes
Subjective: Cindy Snyder is a 44 y.o. female presenting for ER follow up    History of Present Illness:    Seen on 2/10 in ED for ongoing vaginal bleeding  She has a history of Fibroids on Depo-Provera and Anemia.   Ultrasound in 2019 showed 3 small fibroids that were thought to be the source of her heavy bleeding  She saw OB/GYN on 04/17/2019 and received her 64-month Depo injection.    Has had some spotting starting on 04/27/2019.   Typically when she gets the Depo shot she has spotting and cramping symptoms for about a week to 2 weeks after the shot.   This time bleeding and spotting continued for almost 1 month  She also had a Pap smear on 04/17/2019 that was positive for HPV and she is scheduled for colposcopy on 06/06/2019.   Lab work was normal with normal Hgb levels      Active Ambulatory Problems     Diagnosis Date Noted   ? Pregnancy 01/17/2007   ? SVD (spontaneous vaginal delivery) 01/17/2007   ? Grand multiparity in labor and delivery, delivered with or without mention of antepartum condition 01/17/2007   ? Anemia 01/17/2007   ? GBS (group B streptococcus) infection 01/17/2007   ? Fibroids 04/26/2017   ? Cysts of both ovaries 04/26/2017   ? 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 Vaginal bleeding 06/06/2019     Resolved Ambulatory Problems     Diagnosis Date Noted   ? No Resolved Ambulatory Problems     Past Medical History:   Diagnosis Date   ? Ovarian cyst    ? Unspecified problems with internal organs        History reviewed. No pertinent surgical history.    Current Outpatient Medications on File Prior to Visit   Medication Sig Dispense Refill   ? diclofenac (VOLTAREN) 1 % topical gel Apply four g topically to affected area four times daily. 300 g 3   ? medroxyprogesterone acetate (DEPO-PROVERA IM) Inject  into the muscle.       No current facility-administered medications on file prior to visit.        Social History     Socioeconomic History   ? Marital status: Divorced     Spouse name: Not on file   ? Number of children: 10   ? Years of education: Not on file   ? Highest education level: Not on file   Occupational History     Employer: [employer]   Tobacco Use   ? Smoking status: Never Smoker   ? Smokeless tobacco: Never Used   Substance and Sexual Activity   ? Alcohol use: No   ? Drug use: No   ? Sexual activity: Yes     Partners: Male     Birth control/protection: Condom, Injection   Other Topics Concern   ? Not on file   Social History Narrative    Lives with 4 daughters. No pets.        Family History   Problem Relation Age of Onset   ? High Cholesterol Mother    ? Hearing Loss Mother    ? Learning Disability Mother    ? Asthma Maternal Grandmother    ? Cancer Maternal Grandmother    ? Asthma Paternal Grandmother    ? Cancer Paternal Grandmother    ? Cancer Paternal Grandfather    ? Asthma Brother    ?  Arthritis Neg Hx    ? Bleeding Disorders Neg Hx    ? Cancer-Colon Neg Hx    ? Cancer-Breast Neg Hx    ? Cancer-Ovarian Neg Hx    ? Cancer-Uterine Neg Hx    ? Cleft Lip Neg Hx    ? Cervical Cancer Neg Hx    ? Cleft Palate Neg Hx    ? Diabetes Neg Hx    ? Heart Disease Neg Hx    ? Migraines Neg Hx    ? Miscarriages Neg Hx    ? Rashes/Skin Problems Neg Hx    ? Seizures Neg Hx    ? Thyroid Disease Neg Hx    ? VTE Neg Hx    ? Unknown to Patient Neg Hx    ? Blindness Neg Hx    ? Cataract Neg Hx    ? Glaucoma Neg Hx    ? Macular Degen Neg Hx    ? Retinal Detachment Neg Hx        Objective:    Vitals:    06/06/19 0757   BP: 119/60   Pulse: 63   Resp: 16   Temp: 36.5 ?C (97.7 ?F)   SpO2: 100%   Weight: 83.1 kg (183 lb 3.2 oz)   Height: 165.1 cm (65)   PainSc: Zero       Body mass index is 30.49 kg/m?Marland Kitchen    General: No acute distress, non-labored breathing. AAOx3. Afebrile  Head: Normocephalic and atraumatic.    Eyes: Pupils are equal. No scleral icterus.    Cardiovascular: Regular Rate, No edema  Pulmonary/Chest: Breathing is non labored, not in respiratory distress. Abdominal: nondistended  Musculoskeletal: No deformity.    Skin: No rash or wound on exposed areas   Neuro: Grossly intact.  Psychiatric: Normal mood and affect.   Nursing note and vitals reviewed.      Assessment/Plan:  [name] was seen today for emergency room follow up.    Diagnoses and all orders for this visit:    Anemia, unspecified type  Fibroids  Vaginal bleeding  - bleeding has since resolved. Given history of heavy bleeding and known fibroids she was likely experiencing ongoing spotting after her Depo shot. She will get her colposcopy today as scheduled. If heavy bleeding recurs we can consider repeating ultrasound or getting an endometrial biopsy. Her anemia also appears to have improved and she now has normal hemoglobin level    Will reach out to health department for patients vaccination history and any screening lab work and updated health maintenance as appropriate. If needs updated tetanus shot can schedule this as nurses visit.      Return precautions discussed, questions sought and answered. Patient expressed understanding and agreed with treatment plan.     Patient staffed with Dr. Derald Macleod T. Primitivo Gauze, M.D.  Resident Physician, PGY-3  Department of Family Medicine

## 2019-06-06 NOTE — Patient Instructions
General Instructions  For prompt and efficient communication, MyChart is preferred. Please sign up.  [email or web address]      To Contact Me:   Please send a MyChart message to your doctor or call [phone] to reach your doctor's nurse team. Please only call once in a 24-hour period. Leave a voicemail for the nurse to respond.  Calls or messages received after 4pm will be returned the next business day.  To Have Medication Refilled:   Please use the MyChart Refill request or contact your pharmacy directly to request medication refills. Please allow 72 hours.  To Receive Your Test Results:  ? Please allow 2 business day for labs to result in MyChart.  ? Please allow 4 business days for other tests, including cardiac studies, blood bank, and radiology results.  ? It can take up to 10 days for pathology  Results NOT released to MyChart:  ? OB Ultrasound  ? Genetic testing  ? Labs that are sent to outside facilities  Our Lab (Location, Hours, and Fasting Information)  Lab Location: the 1st floor of the Medical Office Building, directly to the left of the Information Desk.  Lab Hours: Monday 6:30 am-6 pm, Tuesday-Friday 7 am-6 pm, and Saturday 7 am-noon.   Fasting for Lab: For fasting labs, please:   Do not eat for at least 8-10 hours before having your labs drawn, drink plenty of water, and make sure to continue your medications as prescribed (unless otherwise specified).  Radiology is on the 2nd floor of the Medical Office Building. Please call [phone], option #1; Mammogram at [facility] location, please call [phone], option #1.  To Schedule an Appointment:   Contact our schedulers at [phone] and select #1 to make an appointment. At this time, you will be encouraged to signed up to MyChart if you not active.     To Receive Appointment Reminders on Your Cell Phone:   Make sure we have your cell phone number, and text [place] to [phone].  For Urgent Questions:   For medical emergencies, call 911.  On nights, weekends or holidays, call the Hospital operator at [phone], and ask for the ?Ob/Gyn Physician on call or Certified Nurse Midwife on call.?        We value your feedback and you may receive a survey about your experience with our office. If you had a favorable experience, the only positive survey results that we will receive are if we are rated in the 9 or 10 range out of 10 points. Please let us know why we did not earn 9-10 rating.  Thank you.

## 2019-06-08 ENCOUNTER — Encounter
Admit: 2019-06-08 | Discharge: 2019-06-08 | Payer: No Typology Code available for payment source | Primary: Student in an Organized Health Care Education/Training Program

## 2019-06-23 ENCOUNTER — Encounter
Admit: 2019-06-23 | Discharge: 2019-06-23 | Payer: No Typology Code available for payment source | Primary: Student in an Organized Health Care Education/Training Program

## 2019-06-29 ENCOUNTER — Encounter
Admit: 2019-06-29 | Discharge: 2019-06-29 | Payer: No Typology Code available for payment source | Primary: Student in an Organized Health Care Education/Training Program

## 2019-06-30 ENCOUNTER — Encounter
Admit: 2019-06-30 | Discharge: 2019-06-30 | Payer: No Typology Code available for payment source | Primary: Student in an Organized Health Care Education/Training Program

## 2019-06-30 NOTE — Telephone Encounter
Pt called and states that she needs to schedule her next depo provera injection.  I called pt back and she says any of the days from 3/15-3/19 in the afternoon, while trying to get her scheduled the phone lost signal twice.  The third time I left a vm msg when I got her scheduled and what time and to call if that date and time doesn't work for her.

## 2019-07-06 ENCOUNTER — Encounter
Admit: 2019-07-06 | Discharge: 2019-07-06 | Payer: No Typology Code available for payment source | Primary: Student in an Organized Health Care Education/Training Program

## 2019-07-06 ENCOUNTER — Ambulatory Visit
Admit: 2019-07-06 | Discharge: 2019-07-07 | Payer: Private Health Insurance - Indemnity | Primary: Student in an Organized Health Care Education/Training Program

## 2019-07-06 DIAGNOSIS — Z3042 Encounter for surveillance of injectable contraceptive: Principal | ICD-10-CM

## 2019-07-06 MED ORDER — MEDROXYPROGESTERONE 150 MG/ML IM SYRG
150 mg | Freq: Once | INTRAMUSCULAR | 0 refills | Status: CP
Start: 2019-07-06 — End: ?

## 2019-07-06 NOTE — Progress Notes
Patient presents to clinic for her Depo injection.  Given in left gluteal.  Next injection due June 3 - June 17.

## 2019-09-20 ENCOUNTER — Encounter
Admit: 2019-09-20 | Discharge: 2019-09-20 | Payer: No Typology Code available for payment source | Primary: Student in an Organized Health Care Education/Training Program

## 2019-09-20 NOTE — Telephone Encounter
Patient left message stating that she needed to schedule her depo injection.    Spoke with patient and appointment scheduled for 09/25/19 at 9:00 AM.

## 2019-09-25 ENCOUNTER — Ambulatory Visit
Admit: 2019-09-25 | Discharge: 2019-09-26 | Payer: Private Health Insurance - Indemnity | Primary: Student in an Organized Health Care Education/Training Program

## 2019-09-25 ENCOUNTER — Encounter
Admit: 2019-09-25 | Discharge: 2019-09-25 | Payer: No Typology Code available for payment source | Primary: Student in an Organized Health Care Education/Training Program

## 2019-09-25 MED ORDER — MEDROXYPROGESTERONE 150 MG/ML IM SYRG
150 mg | Freq: Once | INTRAMUSCULAR | 0 refills | Status: CP
Start: 2019-09-25 — End: ?
  Administered 2019-09-25: 14:00:00 150 mg via INTRAMUSCULAR

## 2019-09-25 NOTE — Progress Notes
Patient presented to clinic for her Depo injection. Last injection was 07-06-19. Injection was due between 09-21-19 to 10-05-19. Injection was given in the left gluteal. Patient tolerated injection well with no adverse reactions noted. Patient was advised that her next injection is due between 12-11-19 to 12-25-19.

## 2019-09-26 DIAGNOSIS — Z3042 Encounter for surveillance of injectable contraceptive: Principal | ICD-10-CM

## 2019-12-26 ENCOUNTER — Ambulatory Visit
Admit: 2019-12-26 | Discharge: 2019-12-27 | Payer: Private Health Insurance - Indemnity | Primary: Student in an Organized Health Care Education/Training Program

## 2019-12-26 MED ORDER — COVID-19 VACC,MRNA(PFIZER)(PF) 30 MCG/0.3 ML IM SUSR
30 ug | Freq: Once | INTRAMUSCULAR | 0 refills | Status: CP
Start: 2019-12-26 — End: ?
  Administered 2019-12-26: 15:00:00 30 ug via INTRAMUSCULAR

## 2019-12-27 ENCOUNTER — Encounter
Admit: 2019-12-27 | Discharge: 2019-12-27 | Payer: No Typology Code available for payment source | Primary: Student in an Organized Health Care Education/Training Program

## 2019-12-28 ENCOUNTER — Ambulatory Visit
Admit: 2019-12-28 | Discharge: 2019-12-28 | Payer: No Typology Code available for payment source | Primary: Student in an Organized Health Care Education/Training Program

## 2019-12-28 ENCOUNTER — Encounter
Admit: 2019-12-28 | Discharge: 2019-12-28 | Payer: No Typology Code available for payment source | Primary: Student in an Organized Health Care Education/Training Program

## 2019-12-28 DIAGNOSIS — R6889 Other general symptoms and signs: Secondary | ICD-10-CM

## 2019-12-28 DIAGNOSIS — N83209 Unspecified ovarian cyst, unspecified side: Secondary | ICD-10-CM

## 2019-12-28 DIAGNOSIS — D219 Benign neoplasm of connective and other soft tissue, unspecified: Secondary | ICD-10-CM

## 2020-01-10 ENCOUNTER — Encounter
Admit: 2020-01-10 | Discharge: 2020-01-10 | Payer: No Typology Code available for payment source | Primary: Student in an Organized Health Care Education/Training Program

## 2020-01-10 NOTE — Telephone Encounter
Patient left vmx requesting a Depo inj appt for herself and her two daughters, Data processing manager and Lawanna Kobus.      Spoke with patient.  Advised patient her last Depo was given on 6/7 and that she is past her window.  Patient scheduled for Depo inj on 9/23, will need UPT prior to injection.  Patient verified daughter, Patience name and DOB.  Last depo inj given to Patience on 7/1.  Next inj scheduled for 9/27 at 3:15.  Patient verified other daughter, Angel's name and DOB.  Last depo inj given to [place] on 7/1.  Next inj scheduled on 9/27 at 3:30.  Patient v/u, no further questions.

## 2020-01-11 ENCOUNTER — Encounter
Admit: 2020-01-11 | Discharge: 2020-01-11 | Payer: No Typology Code available for payment source | Primary: Student in an Organized Health Care Education/Training Program

## 2020-01-11 ENCOUNTER — Ambulatory Visit
Admit: 2020-01-11 | Discharge: 2020-01-12 | Payer: Private Health Insurance - Indemnity | Primary: Student in an Organized Health Care Education/Training Program

## 2020-01-11 MED ORDER — MEDROXYPROGESTERONE 150 MG/ML IM SYRG
150 mg | Freq: Once | INTRAMUSCULAR | 0 refills | Status: CP
Start: 2020-01-11 — End: ?
  Administered 2020-01-11: 14:00:00 150 mg via INTRAMUSCULAR

## 2020-01-12 DIAGNOSIS — Z3042 Encounter for surveillance of injectable contraceptive: Secondary | ICD-10-CM

## 2020-01-19 ENCOUNTER — Encounter
Admit: 2020-01-19 | Discharge: 2020-01-19 | Payer: No Typology Code available for payment source | Primary: Student in an Organized Health Care Education/Training Program

## 2020-01-19 ENCOUNTER — Ambulatory Visit
Admit: 2020-01-19 | Discharge: 2020-01-20 | Payer: Private Health Insurance - Indemnity | Primary: Student in an Organized Health Care Education/Training Program

## 2020-01-19 MED ORDER — COVID-19 VACC,MRNA(PFIZER)(PF) 30 MCG/0.3 ML IM SUSR
30 ug | Freq: Once | INTRAMUSCULAR | 0 refills | Status: CP
Start: 2020-01-19 — End: ?

## 2020-01-24 ENCOUNTER — Encounter
Admit: 2020-01-24 | Discharge: 2020-01-24 | Payer: No Typology Code available for payment source | Primary: Student in an Organized Health Care Education/Training Program

## 2020-03-03 NOTE — Progress Notes
Subjective:       History of Present Illness  Cindy Snyder is a 44 y.o. female.    Patient is here today for:  Chief Complaint   Patient presents with   ? Abdominal pain       Patient answers are not available for this visit.    Hx provided by: Self    Stomach pain:  - Drank carmel macchiato (like normal), 2 hours later stomach carmping/tight, very painful, made her sweaty, nauseous, presyncopal; this was 1 month ago  - Happened again 1 week later, cramping that was so severe  - Bad gas/bloating getting worse  - Low appeitie-> yesterday ate a pack of crackers, stomach felt full  - Extreme pain twice, but consistnatly having blaoting, low appeite, gassy  - No f/c/n/v/d/c/blood in stool or urine, no mentral chagnes  - Chronic occasional GERD, maybe a bit worse  - Bad cramps last 10 minutes, happened a few years ago but now 2x (once at work, once in the middle of the night)    - Happened in 2018, Stopped randomly    Wt Readings from Last 5 Encounters:   03/04/20 82.1 kg (181 lb)   12/28/19 83 kg (183 lb)   06/06/19 83.3 kg (183 lb 9.6 oz)   06/06/19 83.1 kg (183 lb 3.2 oz)   05/31/19 82.6 kg (182 lb)         Review of Systems  Constitutional: no fever, chills, fatigue or malaise.  HEENT: No rhinorrhea, nasal congestion, sinus pressure or pain.  Cardiovascular:No chest pain, shortness of breath, leg swelling.  Gastrointestinal: no abdominal pain, n/v, diarrhea or constipation.  Genitourinary: No frequency, urgency, hematuria.  Behavioral/Psych: No anxiety or depression.  Sleep: ok    Objective:         ? diclofenac (VOLTAREN) 1 % topical gel Apply four g topically to affected area four times daily.   ? simethicone (GAS RELIEF (SIMETHICONE)) 80 mg chew tablet Chew one tablet by mouth every 6 hours as needed for Flatulence.     Vitals:    03/04/20 0917   BP: 118/60   Pulse: 65   Resp: 16   Temp: 36.8 ?C (98.3 ?F)   TempSrc: Tympanic   Weight: 82.1 kg (181 lb)   Height: 165.1 cm (65)     Body mass index is 30.12 kg/m?Marland Kitchen     Physical Exam  Vitals and nursing note reviewed.   Constitutional:       General: She is not in acute distress.     Appearance: She is not ill-appearing.   HENT:      Head: Normocephalic and atraumatic.   Eyes:      Extraocular Movements: Extraocular movements intact.      Conjunctiva/sclera: Conjunctivae normal.   Cardiovascular:      Rate and Rhythm: Normal rate.   Pulmonary:      Effort: Pulmonary effort is normal. No respiratory distress.   Abdominal:      General: Abdomen is flat. Bowel sounds are normal.      Palpations: Abdomen is soft.      Tenderness: There is no abdominal tenderness. There is no guarding.   Musculoskeletal:         General: Normal range of motion.      Cervical back: Normal range of motion.   Skin:     General: Skin is warm and dry.   Neurological:      General: No focal deficit present.  Mental Status: She is alert.   Psychiatric:         Mood and Affect: Mood normal.         Behavior: Behavior normal.         Thought Content: Thought content normal.            Assessment and Plan:    [name]. Caissie was seen today for abdominal pain.    Diagnoses and all orders for this visit:    Abdominal cramping  -Patient with new onset significant abdominal cramping, twice in 1 month.  No bowel/bladder/B symptoms changes, happened in 2018 but stopped without any effort from patient.  -Patient feeling more bloated and gassy past month, cause?  -No dietary changes, but possibly nontypical onset of food intolerance?  -Biggest question if biliary colic  Orders  -     simethicone (GAS RELIEF (SIMETHICONE)) 80 mg chew tablet; Chew one tablet by mouth every 6 hours as needed for Flatulence.  -     US ABDOMEN COMPLETE; Future; Expected date: 03/04/2020  -Patient also encouraged to keep symptom diary tied to food to try to uncover hidden allergy intolerance  -Next steps would be upper EGD, lab testing for celiac/lactose intolerance/etc.      Visit Disposition     Dispositions ? Return in about 4 weeks (around 04/01/2020), or if symptoms worsen or fail to improve, for Abdominal cramping.                Patient Instructions   1. Keep symptom diary to see what you are eating that could bring this on  2. Get the ultrasound and look at what you eat        Future Appointments   Date Time Provider Department Center   03/07/2020  1:15 PM [facility] ROOM 2-[facility] Radiolog   03/25/2020 10:00 AM [facility] NURSE [facility] OB/GYN   04/15/2020  9:20 AM Wannetta Sender, MD [facility] FM     Discussed with Dr. Elvia Collum, MD    Wannetta Sender, MD  March 04, 2020     Voice recognition software was used for this dictation.  Every attempt was made to edit but small errrors may persist.  Please call me directly if any concerns arise.

## 2020-03-04 ENCOUNTER — Ambulatory Visit
Admit: 2020-03-04 | Discharge: 2020-03-05 | Payer: Private Health Insurance - Indemnity | Primary: Student in an Organized Health Care Education/Training Program

## 2020-03-04 ENCOUNTER — Encounter
Admit: 2020-03-04 | Discharge: 2020-03-04 | Payer: No Typology Code available for payment source | Primary: Student in an Organized Health Care Education/Training Program

## 2020-03-04 DIAGNOSIS — N83209 Unspecified ovarian cyst, unspecified side: Secondary | ICD-10-CM

## 2020-03-04 DIAGNOSIS — R6889 Other general symptoms and signs: Secondary | ICD-10-CM

## 2020-03-04 DIAGNOSIS — D219 Benign neoplasm of connective and other soft tissue, unspecified: Secondary | ICD-10-CM

## 2020-03-04 MED ORDER — SIMETHICONE 80 MG PO CHEW
80 mg | ORAL_TABLET | ORAL | 0 refills | Status: AC | PRN
Start: 2020-03-04 — End: ?

## 2020-03-05 DIAGNOSIS — R109 Unspecified abdominal pain: Principal | ICD-10-CM

## 2020-03-07 ENCOUNTER — Ambulatory Visit
Admit: 2020-03-07 | Discharge: 2020-03-07 | Payer: Private Health Insurance - Indemnity | Primary: Student in an Organized Health Care Education/Training Program

## 2020-03-07 ENCOUNTER — Encounter
Admit: 2020-03-07 | Discharge: 2020-03-07 | Payer: Vision Other Private Insurance | Primary: Student in an Organized Health Care Education/Training Program

## 2020-03-07 ENCOUNTER — Encounter: Admit: 2020-03-07 | Discharge: 2020-03-07 | Primary: Student in an Organized Health Care Education/Training Program

## 2020-03-07 DIAGNOSIS — R109 Unspecified abdominal pain: Secondary | ICD-10-CM

## 2020-03-10 ENCOUNTER — Encounter: Admit: 2020-03-10 | Discharge: 2020-03-10 | Primary: Student in an Organized Health Care Education/Training Program

## 2020-03-10 DIAGNOSIS — R109 Unspecified abdominal pain: Secondary | ICD-10-CM

## 2020-03-11 ENCOUNTER — Encounter: Admit: 2020-03-11 | Discharge: 2020-03-11 | Primary: Student in an Organized Health Care Education/Training Program

## 2020-03-13 ENCOUNTER — Encounter: Admit: 2020-03-13 | Discharge: 2020-03-13 | Primary: Student in an Organized Health Care Education/Training Program

## 2020-03-13 DIAGNOSIS — K8020 Calculus of gallbladder without cholecystitis without obstruction: Secondary | ICD-10-CM

## 2020-03-13 NOTE — Patient Instructions
Surgery: Friday, December 10th with Dr. Flo Shanks     Your surgery will be at the [facility].      Advanced care, conveniently located         Address  [address]  [place], [place] [address]   [phone]    Directions  Located close to [facility], the [address] is just [place] of [address] and [place] in [place], [place]. The main entrance to the hospital is on the south side of the building.    Helpful driving directions  From [id number]  ? Take [employer] from [phone].    ? Go north to [employer].   ? Turn left (west) approximately 1/10 of a mile.   ? Turn left (south) into the [address] (formerly [employer]).   ? At stop sign, turn left. Then take the second right (this will be the right turn after the speed bump).    ? The main entrance is located on the south side of the building. Park in any available space.   Parking  Plentiful complimentary parking is just steps away from the front door, located on the south side of the building. Convenient complimentary parking for [facility] patients is located at that facility.    You will need to check into the kiosk at the main entrance  You will be called up to the main desk for registration.   You will receive a call from the surgical team 1-3 days prior to your surgery, to inform you of what time you will need to arrive the day of your surgery.    -Do not eat or drink (including gum, mints, candy, or chewing tobacco) anything after midnight-12 am the night before your surgery.  -The morning of your surgery brush your teeth and tongue, ok to rinse, but do not drink any water.  -You may take your medications with a sip of water if instructed by your physician to do so.  -Please do not take diabetic medication the morning of your procedure unless otherwise instructed.   -If you are taking blood thinning medications such as Warafin/Coumadin, Clopidogrel/Plavix, or aspirins, please stop taking as instructed prior to the procedure if authorized by your prescribing physician.   -You will need to have someone drive you home.       Please call with any questions or concerns.  Scheduling [facility]) [phone]  Nurse line Dahlia Client, Tresa Endo [place]) [phone]  Fax [phone]              Laparoscopic Cholecystectomy   Laparoscopic Cholecystectomy is a procedure in which the gallbladder is removed by laparoscopic technique. Small incisions are made and ports are placed through these incisions. A camera and instruments are placed through these ports in order to perform the surgery. The gallbladder is removed through one of the incision sites.    Post operative activity:  ? A responsible adult must drive you home.  After receiving sedation or anesthesia, you should not drive, operate heavy machinery or do anything that requires concentration for at least 24 hours after receiving sedation.  ? We recommend that you do NOT do any strenuous activity or heavy lifting until your first post-operative appointment.- typically 2 weeks post surgery date.    Post-operative site care:  ? Okay to shower and get the incision sites wet after 24 hours, do not submerge in water.  ? Expect to feel a ?  bloated? feel to last 1-2 weeks after surgery, sometimes this can cause pain in your shoulders.    Diet:  ? Immediately after surgery, you may begin with liquids and advance diet slowly.  ? In the event of stomach upset or loose bowel movements, please remove fatty foods from your diet for 2 weeks.    Home medications:  ? You may resume your home medications, unless the doctor has specifically asked that you stop a certain medication.    Pain management:  ? Pain medication will be prescribed during the post-operative period. Please take medication as prescribed. Do not take pain medication on an empty stomach.  ? Please do not take your pain medication scripts to [facility]  ? You may use a heating pad, avoid direct contact to the skin.  ? No driving or operating heavy machinery while taking  pain medication.  ? Avoid alcohol while taking pain medication.  ? Pain medication can cause constipation, begin taking senokot (over the counter) when taking pain medication,if no bowel movement in 48 hrs post surgery, take senokot and milk of magnesia until daily bowel movement.  ? You may take over the counter pain relieving medication as needed for break through pain.    ? If you choose not to take the prescribed pain medication, please use over the counter medications for pain control.    ? For over the counter medications, please use Tylenol (Acetaminophen) or Ibuprofen.   ? It is not uncommon to have pain that radiates to your shoulder(s) after surgery, please walk to help relieve this discomfort    Please notify the nurse with the following symptoms: [phone]  ? If pain in not controlled with pain medication.  ? No bowel movement in 4 days.  ? Signs and symptoms of infection  o Chills, body aches, fever greater than 101.5  o Redness, swelling, warmth around incision sites  o Purulent drainage from incision sites    If you feel you are having an EMERGENCY, please call 911 or go to your nearest emergency room.

## 2020-03-13 NOTE — Progress Notes
Date of Service: 03/13/2020    Subjective:             Cindy Snyder is a 44 y.o. female.    History of Present Illness  Cindy Snyder is a very pleasant 44 year old female who presets for initial surgical evaluation of her gallbladder.  She has a past medical history ovarian cyst and fibroids.  She reports that for the past two years she has had intermittent epigastric pain with radiation to her right scapula.  These symptoms have been worsening over the past month or two.  The pain is so intense that it causes her to go into the fetal position.  This most recently happened after she drank a caramel macchiato.  The patient denies any history of abdominal operations.  She has ten children, ranging in age from 13-27.      Medical History:   Diagnosis Date   ? Fibroids    ? Ovarian cyst    ? Unspecified problems with internal organs      No past surgical history on file.  Family History   Problem Relation Age of Onset   ? High Cholesterol Mother    ? Hearing Loss Mother    ? Learning Disability Mother    ? Asthma Maternal Grandmother    ? Cancer Maternal Grandmother    ? Asthma Paternal Grandmother    ? Cancer Paternal Grandmother    ? Cancer Paternal Grandfather    ? Asthma Brother    ? Arthritis Neg Hx    ? Bleeding Disorders Neg Hx    ? Cancer-Colon Neg Hx    ? Cancer-Breast Neg Hx    ? Cancer-Ovarian Neg Hx    ? Cancer-Uterine Neg Hx    ? Cleft Lip Neg Hx    ? Cervical Cancer Neg Hx    ? Cleft Palate Neg Hx    ? Diabetes Neg Hx    ? Heart Disease Neg Hx    ? Migraines Neg Hx    ? Miscarriages Neg Hx    ? Rashes/Skin Problems Neg Hx    ? Seizures Neg Hx    ? Thyroid Disease Neg Hx    ? VTE Neg Hx    ? Unknown to Patient Neg Hx    ? Blindness Neg Hx    ? Cataract Neg Hx    ? Glaucoma Neg Hx    ? Macular Degen Neg Hx    ? Retinal Detachment Neg Hx      Social History     Socioeconomic History   ? Marital status: Divorced     Spouse name: Not on file   ? Number of children: 10   ? Years of education: Not on file   ? Highest education level: Not on file   Occupational History     Employer: [employer]   Tobacco Use   ? Smoking status: Never Smoker   ? Smokeless tobacco: Never Used   Vaping Use   ? Vaping Use: Never used   Substance and Sexual Activity   ? Alcohol use: No   ? Drug use: No   ? Sexual activity: Yes     Partners: Male     Birth control/protection: Condom, Injection   Other Topics Concern   ? Not on file   Social History Narrative    Lives with 4 daughters. No pets.      Vaping/E-liquid Use   ?  Vaping Use Never User                    Review of Systems   Constitutional: Negative.    HENT: Negative.    Eyes: Negative.    Respiratory: Negative.    Cardiovascular: Negative.    Gastrointestinal: Negative.    Endocrine: Negative.    Genitourinary: Negative.    Musculoskeletal: Negative.    Skin: Negative.    Allergic/Immunologic: Negative.    Neurological: Negative.    Hematological: Negative.    Psychiatric/Behavioral: Negative.          Objective:         ? diclofenac (VOLTAREN) 1 % topical gel Apply four g topically to affected area four times daily.   ? simethicone (GAS RELIEF (SIMETHICONE)) 80 mg chew tablet Chew one tablet by mouth every 6 hours as needed for Flatulence.     Vitals:    03/13/20 0931   BP: 114/61   BP Source: Arm, Right Upper   Patient Position: Sitting   Pulse: 59   Resp: 20   Temp: 36.4 ?C (97.5 ?F)   TempSrc: Temporal   SpO2: 100%   Weight: 82.1 kg (181 lb)   Height: 167.6 cm (66)   PainSc: Zero     Body mass index is 29.21 kg/m?Marland Kitchen     Physical Exam  Vital signs reviewed  General:  Well-developed, well-nourished, healthy-appearing  yo in no acute distress  HEENT: Normocephalic, atraumatic, non-icteric, trachea is midline, hearing intact to conversation,   Lungs: Respirations are even and unlabored, patient is on room air, no cough or dyspnea observed  Cardiovascular: No edema to bilateral LE,   Abdomen: Soft, tender in RUQ, nondistended, no palpable masses or organomegaly  Neurological: Alert and oriented x 3, speech is clear and fluent, face is symmetrical, sensation is intact throughout  Psych: Mood and affect are normal  Skin:  Skin is clean, dry and intact.  Normal temperature.  No rashes or lesions noted.  Musculoskeletal:  ambulates unassisted.  MAE without focal motor deficits. No gross atrophy noted.      COMPLETE ABDOMINAL ULTRASOUND     CLINICAL INDICATION: Female, 44 years old. Severe intermittent abdominal   cramping.     TECHNIQUE: Multiple grayscale and color Doppler ultrasound images were   obtained of the abdomen.     COMPARISON: 12/25/2016     FINDINGS:     Liver and Biliary System: ?Homogeneous echotexture, normal size measuring   15.7 cm. ?No discrete hepatic masses are identified. There is no   intrahepatic bile duct dilatation. The common duct measures is 3 mm at the   porta hepatis. ?The gallbladder is filled with shadowing stones. No   gallbladder wall thickening or pericholecystic fluid. Main portal vein   flow is antegrade by color Doppler.     Pancreas: Visualized portions are normal. ?     Spleen: Normal in size measuring 8.9 cm. ?     Kidneys and Bladder: The right kidney measures 10.1 x 4.6 cm and contains   a tiny simple cyst in its upper pole. ?The left kidney measures 10.6 x 5.0   cm. ?No hydronephrosis. The urinary bladder is minimally distended.     Aorta: Visualized portions are normal in caliber.     IVC: Visualized portions are normal in caliber.     Peritoneal Space: ?No abdominopelvic ascites.     IMPRESSION       Cholelithiasis. No sonographic evidence of  acute cholecystitis. Normal caliber bile ducts.       ?Finalized by Kathyrn Lass, M.D. on 03/07/2020 2:05 PM. Dictated by   Kathyrn Lass, M.D. on 03/07/2020 2:02 PM.      Assessment and Plan:  44 yo female with symptomatic cholelithiasis     The patient was seen and examined with Dr. Flo Shanks.  On assessment, the patient has postprandial epigastric and RUQ pain as well as imaging consistent with cholelithiasis.  Dr. Flo Shanks recommends proceeding with surgical intervention.  Risks versus benefits of laparoscopic cholecystectomy were discussed in detail with the patient by Dr. Flo Shanks including possible infection, bleeding, bile duct injury, damage to vessels or surrounding structures and possible conversion to open. In total, 25 minutes was spent with the patient explaining the disease, performing a detailed history and physical and describing the surgical treatment plan.  The patient was provided the opportunity to have all of their questions and concerns addressed to their satisfaction and wishes to electively proceed.  The patient was instructed to eat a low fat, low dairy diet after the operation.  The patient is aware that there will be a 10 pound weight lifting restriction for two weeks after surgery.  Advancement of activity will be discussed at the two week post operative appointment.  Pre-operative and post-operative instructions were placed in the After Visit Summary.  Surgical date will be agreed upon at the earliest convenience.  The patient will call our office in the meantime with any questions, concerns or change in symptoms.    Luna Glasgow, APRN-BC  Pager [phone]    [facility]  Department of General Surgery  [facility]  [address]  [place], [place]  [address]

## 2020-03-19 ENCOUNTER — Encounter: Admit: 2020-03-19 | Discharge: 2020-03-19 | Primary: Student in an Organized Health Care Education/Training Program

## 2020-03-20 ENCOUNTER — Encounter: Admit: 2020-03-20 | Discharge: 2020-03-20 | Primary: Student in an Organized Health Care Education/Training Program

## 2020-03-25 ENCOUNTER — Encounter: Admit: 2020-03-25 | Discharge: 2020-03-25 | Primary: Student in an Organized Health Care Education/Training Program

## 2020-03-25 DIAGNOSIS — N83209 Unspecified ovarian cyst, unspecified side: Secondary | ICD-10-CM

## 2020-03-25 DIAGNOSIS — R6889 Other general symptoms and signs: Secondary | ICD-10-CM

## 2020-03-25 DIAGNOSIS — D219 Benign neoplasm of connective and other soft tissue, unspecified: Secondary | ICD-10-CM

## 2020-03-25 NOTE — Telephone Encounter
Patient left [facility] has appt today at 10 for depo, needs to reschedule, sick.    Rtd call to patient appt rescheduled. V/u, no further questions.

## 2020-03-28 ENCOUNTER — Encounter: Admit: 2020-03-28 | Discharge: 2020-03-28 | Primary: Student in an Organized Health Care Education/Training Program

## 2020-03-28 ENCOUNTER — Encounter
Admit: 2020-03-28 | Discharge: 2020-03-29 | Payer: Private Health Insurance - Indemnity | Primary: Student in an Organized Health Care Education/Training Program

## 2020-03-28 DIAGNOSIS — J029 Acute pharyngitis, unspecified: Secondary | ICD-10-CM

## 2020-03-28 LAB — COVID-19 (SARS-COV-2) PCR: Lab: DETECTED mL/min — AB (ref 0–0.80)

## 2020-03-28 NOTE — Patient Instructions
Coronavirus Disease 2019 (COVID-19): Overview  Coronavirus disease 2019 (COVID-19) is a respiratory illness. It's caused by a new (novel) coronavirus. There are many types of coronavirus. Coronaviruses are a very common cause of colds and bronchitis. They may sometimes cause lung infection (pneumonia). Symptoms can range from mild to severe. Some people have no symptoms.?These viruses are also found in some animals.   All 50 states in the [place] have reported cases of COVID-19. Most states report community spread of COVID-19. This means the source of the illness is not known.?COVID-19 is a rapidly-emerging infectious disease. This means that scientists are actively researching it.?There are information updates regularly.   Public health officials are working to find the source. How the virus spreads is not yet fully understood, but it seems to spread and infect people fairly easily. Some people who have been infected in an area may not be sure how or where they were infected. The virus may be spread through droplets of fluid that a person coughs or sneezes into the air. It may be spread if you touch a surface with the virus on it, such as a handle or object, and then touch your eyes, nose, or mouth.   For the latest information, visit the CDC website at [employer]. Or call 800-CDC-INFO 570-517-8307).     What are the symptoms of COVID-19?  Some people have no symptoms or mild symptoms. Symptoms can also vary from person to person. As experts learn more about COVID-19, other symptoms are being reported. Symptoms may appear 2 to 14 days after contact with the virus:   ? Fever or chills  ? Coughing  ? Trouble breathing or feeling short of breath  ? Sore throat  ? Stuffy or runny nose  ? Headache and body aches  ? Fatigue  ? Nausea, vomiting, diarrhea, or abdominal pain  ? New loss of sense of smell or taste  You can check your symptoms with the CDC?s Coronavirus Self-Checker.     What are possible complications from COVID-19?  In many cases, this virus can cause infection (pneumonia) in both lungs. In some cases, this can cause death. Certain people are at higher risk for complications. This includes older adults and people with serious chronic health conditions such as heart or lung disease, diabetes, or kidney disease. It includes people with health conditions that suppress the immune system. And it includes people taking medicines that suppress the immune system.   As experts learn more about COVID-19, other complications are being reported that may be linked to COVID-19. Rarely, some children have developed severe complications called multisystem inflammatory syndrome in children (MIS-C). MIS-C seems to be similar to [facility] disease, a rare condition causing inflammation of blood vessels and body organs. It's not yet known if MIS-C happens only in children, or if adults are also at risk. It's also not known if it's related to COVID-19, because many children, but not all, have tested positive for the virus. Experts continue to study MIS-C. The CDC advises healthcare providers to report to local health departments any person under age 57 years old who is ill enough to be in the hospital and has all of the following:   ? A fever over 100.4?F (38.0?C) for more than 24 hours and a positive SARS-CoV-2 test or exposure to the virus in the last 4 weeks   ? Inflammation in at least 2 organs such as the heart, lungs, or kidneys with lab tests that show inflammation   ? No  other diagnoses besides COVID-19 explain the child's symptoms     How is COVID-19 diagnosed?  Your healthcare provider will ask about your symptoms. He or she will ask where you live, and about your recent travel, and any contact with sick people. If your healthcare provider thinks you may have COVID-19, he or she will consider whether to test you for COVID-19. This depends on the availability of testing in your area, and how sick you are. Follow all instructions from your healthcare provider. Guidelines for testing may change as more information about the virus becomes available. Currently, COVID-19 is diagnosed by:   ? Viral test.  Viral tests tell if you have a current COVID-19 infection. A nose-throat swab may be wiped inside your nose to the back of your throat. Or a sample of your saliva may be taken. Either of these samples will be checked for the SARS-CoV-2 virus. Availability of tests vary. Some test kits can be done at home. But both nose-throat swabs and saliva tests must be sent to a lab to be looked at.   If your healthcare provider thinks or confirms that you have COVID-19, you may have other tests. These tests may include:   ? Antibody blood test.  Antibody tests are being looked at to find out if a person has previously been infected with the virus and may now have antibodies such as SARS AB IgG in their blood to give some immunity. The accuracy and availability of antibody tests vary. An antibody test may not be able to show if you have a current infection because it can take up to a few weeks after infection to make antibodies. It's not yet known how long immunity lasts after being infected with the virus.   ? Sputum culture.  A small sample of mucus coughed from your lungs (sputum) may be collected if you have a moist cough. It may be checked for the virus or to look for pneumonia.   ? Imaging tests.  You may have a chest X-ray or CT scan.       Note about reinfection and your immunity  At this time, it's unclear if people can be reinfected with COVID-19. The CDC notes that if a person has fully recovered from COVID-19 and is retested within 3 months of the first infection, they may continue to have low levels of the virus in their body and test positive for COVID-19, even though they are not spreading COVID-19. Having a positive COVID-19 test after an infection doesn't mean you can't be reinfected. It's not yet known how long immunity lasts after being infected with the virus.   How is COVID-19 treated?  There is currently no medicine proven to prevent or treat the virus. Some experimental medicines are being tested for COVID-19. Other medicines used to treat other conditions are being looked at for COVID-19, but these are not currently approved to treat it.   The most proven treatments right now are those to help your body while it fights the virus. This is known as supportive care. Supportive care may include:   ? Getting rest.  This helps your body fight the illness.   ? Staying hydrated.  Drinking liquids is the best way to prevent dehydration.. Try to drink 6 to 8 glasses of liquids every day, or as advised by your provider. Also check with your provider about which fluids are best for you. Don't drink fluids that contain caffeine or alcohol.   ? Taking over-the-counter (  OTC)?pain medicine.  These are used to help ease pain and reduce fever. Follow your healthcare provider's instructions for which OTC medicine to use.   For severe illness, you may need to stay in the hospital. Care during severe illness may include:   ? IV (intravenous) fluids.  These are given through a vein to help keep your body hydrated.   ? Oxygen. You may be given supplemental oxygen or ventilation with a breathing machine (ventilator). This is done so you get enough oxygen in your body.   ? Prone positioning.  Depending on how sick you are during your hospital stay, your healthcare team may turn you regularly on your stomach. This is called prone positioning. It helps increase the amount of oxygen you get to your lungs. Follow your healthcare team's instructions on position changes while you're in the hospital. Also follow their discharge advice on the best positions to help your breathing once you go home.   People who have had COVID-19 and are fully recovered may be asked by their healthcare team to consider donating plasma. This is called COVID-19 convalescent plasma donation. Plasma from people fully recovered from COVID-19 may contain antibodies to help fight COVID-19 in people who are currently seriously ill with the disease. Experts don't know if the donated plasma will work well as a treatment. Research continues, and the FDA has approved it for emergency use in certain people with serious or life-threatening COVID-19. Talk with your provider to learn more about convalescent plasma donation and whether you qualify to donate.       Are you at risk for COVID-19?  You are at risk for COVID-19 if you have had close contact with someone with the virus, or if you live in or traveled to an area with cases of it. Close contact means being within about 6 feet of someone, or living in the same house or visiting a person who has or may have COVID-19. Some recent studies suggest that COVID-19 may be spread by people who are not showing symptoms.     Preventing the Spread of Infection    ?     Hand Hygiene    The best way to prevent the spread of infections is to wash your hands or use hand sanitizer. Staff will clean hands between tasks and upon entering and exiting your hospital room. ?   For patients and visitors:    Clean hands frequently, upon entering and exiting a room, and after coughing and sneezing.    When washing hands with soap and water:   ? Wet hands with warm water  ? Apply soap   ? Lather soap by rubbing hands together for 20 seconds, covering all surfaces of hands and fingers  ? Rinse hands thoroughly  ? Dry hands with paper towel  ? Use a towel to turn off faucet  ?  When cleaning hands with hand sanitizer:   ? Apply hand sanitizer to hands  ? Rub on hands covering of hands and fingers until dry (about 15 to 20 seconds) ?      Coronavirus Disease 2019 (COVID-19): Caring for Yourself or Others   If you or a household member have symptoms of COVID-19, follow the guidelines below for preventing spread of the virus, and managing symptoms.     If you think you have COVID-19 symptoms  ? Stay home. Call your healthcare provider and tell them you have symptoms of COVID-19. Do this before going to any hospital or  clinic. Follow your provider's instructions. You may be advised to isolate yourself at home. This is called self-isolation.   ? Don?t panic. Keep in mind that other illnesses can cause similar symptoms.   ? Stay away from work, school, and public places. Limit physical contact with family members. Limit visitors. Don't kiss anyone or share eating or drinking utensils. Clean surfaces you touch with disinfectant. This is to help prevent the virus from spreading.   ? If you need to cough or sneeze, do it into a tissue. Then throw the tissue into the trash. If you don't have tissues, cough or sneeze into the bend of your elbow.   ? Don?t share food or personal items with people in your household. This includes items like eating and drinking utensils, towels, and bedding.   ? Wear a cloth face mask around other people. During a public health emergency, medical face masks may be reserved for healthcare workers. You may need to make a cloth face mask of your own. You can do this using a bandana, T-shirt, or other cloth. The CDC has instructions on how to make a face mask. Wear the mask so that it covers both your nose and mouth.   ? If you need to go to a hospital or clinic, expect that the healthcare staff will wear protective equipment such as masks, gowns, gloves, and eye protection. You may be put in a separate room. This is to prevent the possible virus from spreading.   ? Tell the healthcare staff about recent travel. This includes local travel on public transport. Staff may need to find other people you have been in contact with.   ? Follow all instructions the healthcare staff give you.    If you have been diagnosed with COVID-19  ? Stay home and start self-isolation. Don?t leave your home unless you need to get medical care. Don't go to work, school, or public areas. Don't use public transportation or taxis.   ? Follow all instructions from your healthcare provider. Call your healthcare provider?s office before going. They can prepare and give you instructions. This will help prevent the virus from spreading.   ? If you need to go to a hospital or clinic, expect that the healthcare staff will wear protective equipment such as masks, gowns, gloves, and eye protection. You may be put in a separate room. This is to prevent the possible virus from spreading.   ? Wear a face mask. This is to protect other people from your germs. If you are not able to wear a mask, your caregivers should. During a public health emergency, medical face masks may be reserved for healthcare workers. You may need to make a cloth face mask of your own. You can do this using a bandana, T-shirt, or other cloth. The CDC has instructions on how to make a face mask. Wear the mask so that it covers both your nose and mouth.   ? Stay away from other people in your home.  ? Have no contact with pets and animals.  ? Don?t share food or personal items with people in your household. This includes items like eating and drinking utensils, towels, and bedding.   ? If you need to cough or sneeze, do it into a tissue. Then throw the tissue into the trash. If you don't have tissues, cough or sneeze into the bend of your elbow.   ? Wash your hands often.    Self-care at home?  There  is currently no medicine approved to prevent or treat the virus. Some experimental and other medicines are being tested against COVID-19. Other medicines used to treat other conditions are being looked at for COVID-19, but they are not currently approved to treat it.   Current treatment is mainly aimed at helping your body while it fights the virus. This is known as supportive care. Take care of yourself at home by:   ? Getting rest. This helps your body fight the illness.   ? Staying hydrated.  Drinking liquids is the best way to prevent dehydration. Try to drink 6 to 8 glasses of liquids every day, or as advised by your provider. Also check with your provider about which fluids are best for you. Don't drink fluids that contain caffeine or alcohol.   ? Taking over-the-counter (OTC) pain medicine. These are used to help ease pain and reduce fever. Follow your healthcare provider's instructions for which OTC medicine to use.   If you've been in the hospital for suspected or confirmed COVID-19 and now are home, follow all of your healthcare team's instructions. This will include when it's OK to stop self-isolation. You may also get instructions on position changes to help your breathing, such as lying on your belly (prone positioning).   If you've had confirmed COVID-19, your healthcare team may ask you to consider donating your plasma. This is called COVID-19 convalescent plasma donation. Plasma from people fully recovered from COVID-19 may contain antibodies to help fight COVID-19 in people who are currently seriously ill with the disease. Experts don't know the safety of COVID-19 convalescent plasma or how well it works. Research continues. The FDA has approved it for emergency use in certain people with serious or life-threatening COVID-19.     Caring for a sick person?  ? Follow all instructions from healthcare staff.  ? Wash your hands often.  ? Wear protective clothing as advised.  ? Make sure the sick person wears a mask. If they can't wear a mask, don't stay in the same room with the person. If you must be in the same room, wear a face mask. When wearing a mask, make sure that it covers both the nose and mouth.   ? Keep track of the sick person?s symptoms.  ? Clean home surfaces often with disinfectant. This includes phones, kitchen counters, fridge door handle, bathroom surfaces, and others.   ? Don?t let anyone share household items with the sick person. This includes eating and drinking tools, towels, sheets, or blankets.   ? Clean fabrics and laundry thoroughly.  ? Keep other people and pets away from the sick person.    When you can stop self-isolation  When you are sick with COVID-19, you should stay away from other people. This is called self-isolation.   Your limits are different if you've had COVID-19 in the last 3 months but are fully recovered without symptoms and you have been exposed to someone with COVID-19. If you are symptom-free, you don't need to stay home away from others or be retested. The CDC doesn't recommend retesting unless you have symptoms of COVID-19 and your new symptoms can't be linked to another illness. Contact your healthcare provider if you have any questions. If you develop symptoms, stay home. If you had COVID-19 over 3 months ago and have been exposed again, treat it like you've never had COVID-19 and stay home, limit your contact with others, call your provider, and monitor for symptoms.   If you  are normally healthy, the CDC does not advise retesting for COVID-19 with nose-throat swabs. You can stop self-isolation when all 3 of these are true:   1. You have had no fever for at least 24 hours. This means no fever without medicine that reduces fever, such as acetaminophen, for at least 24 hours.   2. Your symptoms such as cough or trouble breathing have improved.   3. It has been at least 10 days since your first symptoms started.   Talk with your healthcare provider before you leave home. Tell them if the 3 things above are true for you. They may tell you it?s OK to leave home. In some cases, your state or local area may have specific advice. Your healthcare provider will tell you more.?   If you have a weak immune system and COVID-19, or if you've had severe COVID-19,  your instructions on when to stop isolation will be somewhat different. Some conditions and treatments can cause a weak immune system. These include cancer treatment, bone marrow or organ transplants, and conditions such as HIV or other immune system disorders. You may be advised to stay home from 10 days to 20 days after your symptoms first started. Your healthcare provider may want to retest you for COVID-19. Follow your provider's instructions.   When you return to public settings  When you are well enough to go outside your home, consider the CDC's guidance on cloth face masks:   ? The CDC advises all people over age 42 to wear cloth face masks in public settings when around people outside of their household, especially when it's hard to socially distance. For example, wear a face mask in populated places such as public transit, public protests and marches, and crowded stores, bars, and restaurants.   ? Cloth masks may help prevent people who have COVID-19 form spreading the virus to others.   ? Cloth masks are most likely to reduce COVID-19 spread when masks are widely used by people who are out in the public.   Certain people should not wear a face covering. This includes:   ? Children younger than 69 years old  ? Anyone with a health, developmental, or mental health condition that can be made worse by wearing a mask   ? Anyone who is unconscious or unable to remove the face covering without help. See the CDC's guidance on who should not wear a face mask.     When to call your healthcare provider  Call your healthcare provider right away if a sick person has any of these:   ? Trouble breathing  ? Pain or pressure in chest  If a sick person has any of these, call 911:  ? Trouble breathing that gets worse  ? Pain or pressure in chest that gets worse  ? Blue tint to lips or face  ? Fast or irregular heartbeat  ? Confusion or trouble waking  ? Fainting or loss of consciousness  ? Coughing up blood    Going home from the hospital   If you were diagnosed with COVID-19 and were recently discharged from the hospital:   ? Follow the instructions above for self-care and isolation.  ? Follow the hospital healthcare team?s specific instructions.   ? Ask questions if anything is unclear to you. Write down answers so you remember them.

## 2020-03-28 NOTE — Telephone Encounter
Patient called and has been sick over the weekend.  She states she had right ear pain and presented to the ED.  They diagnosed her with an ear infection and a sinus infection.  They started her on ABX.  Since then she has lost her sense of smell (she works in [employer] and was very surprised she couldn't smell anything) has had a cough, headache and sore throat.    Surgery for tomorrow is postponed and COVID swab scheduled for today.  Patient aware and states understanding.

## 2020-03-29 ENCOUNTER — Encounter: Admit: 2020-03-29 | Discharge: 2020-03-29 | Primary: Student in an Organized Health Care Education/Training Program

## 2020-03-29 DIAGNOSIS — G4452 New daily persistent headache (NDPH): Secondary | ICD-10-CM

## 2020-03-29 DIAGNOSIS — R439 Unspecified disturbances of smell and taste: Principal | ICD-10-CM

## 2020-03-29 DIAGNOSIS — Z20822 Contact with and (suspected) exposure to covid-19: Secondary | ICD-10-CM

## 2020-03-29 NOTE — Telephone Encounter
Spoke with patient and verified full name and date of birth. Informed patient of POSITIVE COVID-19 Test Result. Patient was advised to remain home, except to get medical care, to separate themselves from other people in their home until 10 days from positive test date with the last three days fever free (without the use of antipyretics), and improvement in respiratory symptoms.     Patient advised to call prior to seeking medical care in any setting to notify staff of positive COVID-19 test result. Advised patient to wash their hands often with soap and water for at least 20 seconds frequently, but especially after blowing their nose and after sneezing. Advised patient to clean high-touch surfaces/objects in the home daily.     Patient was asked if they were still experiencing any symptoms and advised to monitor their symptoms, provide supportive care at home.     Patient was instructed to contact their primary care provider, specialist, and/or ordering provider if symptoms worsen, or to contact 911 if they are in need of emergency services and alert them that they have tested positive for COVID-19. Patient had no further questions.    If you have a condition that compromises your immune system, follow up with your provider as soon as possible for more information.    Date of Symptom Onset: 03/22/20  COVID-19 Symptoms: Yes, Loss of sense of taste and/or smell, Congestion and Persistent Headache  Date of first COVID-19 vaccination:  12/26/19  Date of second COVID-19 vaccination (if applicable):  [birth year]/71  Date of third dose or booster COVID-19 vaccination (if applicable):  N/A  Manufacturer of vaccination:  [place]     Patient enrolled in [place]: Not Applicable  If no or N/A, list reason:  [employer] employee    Does patient require letter stating test result?  No

## 2020-04-15 ENCOUNTER — Encounter
Admit: 2020-04-15 | Discharge: 2020-04-15 | Payer: BC Managed Care – PPO | Primary: Student in an Organized Health Care Education/Training Program

## 2020-04-15 ENCOUNTER — Ambulatory Visit
Admit: 2020-04-15 | Discharge: 2020-04-16 | Payer: BC Managed Care – PPO | Primary: Student in an Organized Health Care Education/Training Program

## 2020-04-15 DIAGNOSIS — D219 Benign neoplasm of connective and other soft tissue, unspecified: Secondary | ICD-10-CM

## 2020-04-15 DIAGNOSIS — R6889 Other general symptoms and signs: Secondary | ICD-10-CM

## 2020-04-15 DIAGNOSIS — N83209 Unspecified ovarian cyst, unspecified side: Secondary | ICD-10-CM

## 2020-04-15 DIAGNOSIS — H9312 Tinnitus, left ear: Secondary | ICD-10-CM

## 2020-04-15 DIAGNOSIS — K8020 Calculus of gallbladder without cholecystitis without obstruction: Secondary | ICD-10-CM

## 2020-04-15 NOTE — Patient Instructions
Tinnitus (Ringing in the Ears)     Treatment may include maskers and hearing aids.   Tinnitus is the term for a noise in your ear not caused by an outside sound. The noise might be a ringing, clicking, hiss, or roar. It can vary in pitch. It may be soft or very loud. For some people, this is a minor problem. But for others, the noise can make it hard to hear, work, and even sleep. When tinnitus can't be cured, treatments may help.  What causes tinnitus?  Loud noises, hearing loss, and earwax can cause tinnitus. So can certain medicines. Large amounts of aspirin or caffeine are sometimes to blame. In many cases, the exact cause of tinnitus is not known.  How is tinnitus treated?  Finding and removing the cause is the best way to treat tinnitus. So your healthcare provider may refer you to an ear, nose, and throat doctor (ENT or otolaryngologist). Your hearing may also be checked by a hearing specialist (audiologist). If you have hearing loss, wearing a hearing aid may help your tinnitus. When the cause can't be found, the tinnitus itself may be treated. Some of the treatments are listed below. Your healthcare provider can tell you more about them:   Maskers. These are small devices that look like hearing aids. They have a pleasant sound that helps cover up the ringing in your ears. Hearing aids and maskers are sometimes used together.   Medicines that treat anxiety and depression. These may ease tinnitus in some people.   Hypnosis or relaxation therapy. This may help head noise seem less severe.   Tinnitus retraining therapy. This combines counseling and maskers. Both can help take your mind off the tinnitus.  To learn more   American Speech-Hearing-Language Association 800-638-8255 www.asha.org   American Tinnitus Association 800-634-8978 www.ata.org   National Institute on Deafness and other Communication Disorders 800-241-1044 www.nidcd.nih.gov/    StayWell last reviewed this educational content on  12/19/2017   2000-2021 [employer]. All rights reserved. This information is not intended as a substitute for professional medical care. Always follow your healthcare professional's instructions.

## 2020-04-15 NOTE — Progress Notes
Subjective:       History of Present Illness  Cindy Snyder is a 44 y.o. female.    Patient is here today for:  Chief Complaint   Patient presents with   ? Abdominal pain       Patient answers are not available for this visit.    Hx provided by: Self    Abdominal cramping:  -Had episode of this 03-04-20, twice in 1 month significant abdominal cramping.  Given simethicone, encouraged to food diary, got ultrasound of abdomen  -Abdominal ultrasound showed cholelithiasis, went to see general surgery 03-13-20  -Was scheduled for surgery but postponed given positive Covid test (03/29/20)  - now, feels fine GI-wise (no more coffee, fried foods, googled it), looking forwarding to rescheduling elective outpatient procedure    L ear tinnitus  - whistling like feedback from microphone, off and on   - my hearing is getting more cloudy  - both sx started before ear infection but can't remember when, a few months ago  - no other HEENT sx, no vertigo/dysequilibrium  - worse when in quiet room with tinnitus  - not pulsatile    Review of Systems otherwise negative      Objective:         ? diclofenac (VOLTAREN) 1 % topical gel Apply four g topically to affected area four times daily.   ? fexofenadine (ALLEGRA) 60 mg tablet Take 60 mg by mouth twice daily.   ? fluticasone propionate (FLONASE ALLERGY RELIEF) 50 mcg/actuation nasal spray, suspension Apply 1 spray into nose as directed twice daily.   ? medroxyprogesterone acetate (DEPO-PROVERA IM) Inject 1 Dose into the muscle Snyder 90 days.   ? simethicone (GAS RELIEF (SIMETHICONE)) 80 mg chew tablet Chew one tablet by mouth Snyder 6 hours as needed for Flatulence.     Vitals:    04/15/20 0916   BP: 120/80   Pulse: 59   Resp: 15   Temp: 36.3 ?C (97.4 ?F)   TempSrc: Tympanic   Weight: 82.6 kg (182 lb)   Height: 165.1 cm (65)     Body mass index is 30.29 kg/m?Marland Kitchen     Physical Exam  Vitals and nursing note reviewed.   Constitutional:       General: She is not in acute distress.     Appearance: She is not ill-appearing.   HENT:      Head: Normocephalic and atraumatic.      Right Ear: Tympanic membrane, ear canal and external ear normal. There is no impacted cerumen.      Left Ear: Tympanic membrane, ear canal and external ear normal. There is no impacted cerumen.   Eyes:      Extraocular Movements: Extraocular movements intact.      Conjunctiva/sclera: Conjunctivae normal.      Pupils: Pupils are equal, round, and reactive to light.   Cardiovascular:      Rate and Rhythm: Normal rate.   Pulmonary:      Effort: Pulmonary effort is normal. No respiratory distress.   Abdominal:      General: Abdomen is flat.   Musculoskeletal:         General: Normal range of motion.      Cervical back: Normal range of motion.   Skin:     General: Skin is warm and dry.   Neurological:      General: No focal deficit present.      Mental Status: She is alert.  Cranial Nerves: No cranial nerve deficit.   Psychiatric:         Mood and Affect: Mood normal.         Behavior: Behavior normal.         Thought Content: Thought content normal.              Assessment and Plan:    Cindy Snyder. Cindy Snyder was seen today for abdominal pain.    Diagnoses and all orders for this visit:    Tinnitus of left ear  -Patient over last few months with worsening of decreased hearing as well as nonpulsatile tinnitus in just left ear.  Given symptoms as above and fact that is unilateral, need for MRI at this time given concern for inner ear tumor/pathology  -We will also send to audiology given decreased hearing, patient with grossly intact hearing on exam today  Orders  -     AMB REFERRAL TO AUDIOLOGY  -     MRI HEAD WO/W CONTRAST; Future; Expected date: 04/15/2020    Calculus of gallbladder without cholecystitis without obstruction  -Patient scheduled for cholecystectomy with general surgery but was canceled due to positive Covid test.  Will call to reschedule procedure, encouraged to do this sooner rather than later but reemphasized not emergent at this time  -Patient not symptomatic, told patient to call or return to clinic or call surgery clinic if this changes        Visit Disposition     Dispositions Check-out Note    ? Return in about 8 weeks (around 06/10/2020), or if symptoms worsen or fail to improve, for Annual physical.   Needs ROR from [facility] for Tdap              Patient Instructions       Tinnitus (Ringing in the Ears)     Treatment may include maskers and hearing aids.   Tinnitus is the term for a noise in your ear not caused by an outside sound. The noise might be a ringing, clicking, hiss, or roar. It can vary in pitch. It may be soft or very loud. For some people, this is a minor problem. But for others, the noise can make it hard to hear, work, and even sleep. When tinnitus can't be cured, treatments may help.  What causes tinnitus?  Loud noises, hearing loss, and earwax can cause tinnitus. So can certain medicines. Large amounts of aspirin or caffeine are sometimes to blame. In many cases, the exact cause of tinnitus is not known.  How is tinnitus treated?  Finding and removing the cause is the best way to treat tinnitus. So your healthcare provider may refer you to an ear, nose, and throat doctor (ENT or otolaryngologist). Your hearing may also be checked by a hearing specialist (audiologist). If you have hearing loss, wearing a hearing aid may help your tinnitus. When the cause can't be found, the tinnitus itself may be treated. Some of the treatments are listed below. Your healthcare provider can tell you more about them:  ? Maskers. These are small devices that look like hearing aids. They have a pleasant sound that helps cover up the ringing in your ears. Hearing aids and maskers are sometimes used together.  ? Medicines that treat anxiety and depression. These may ease tinnitus in some people.  ? Hypnosis or relaxation therapy. This may help head noise seem less severe.  ? Tinnitus retraining therapy. This combines counseling and maskers. Both can help  take your mind off the tinnitus.  To learn more  ? American Speech-Hearing-Language Association 949 169 4959 www.asha.org  ? American Tinnitus Association (737)496-1142 CookingConference.be  ? [employer] on Deafness and other Communication Disorders 234-320-4630 MysteryRaffle.it    Nancie Neas last reviewed this educational content on 12/19/2017  ? 2000-2021 [employer], [place]. All rights reserved. This information is not intended as a substitute for professional medical care. Always follow your healthcare professional's instructions.          Discussed with Dr. Greer Pickerel, MD    No future appointments.    Wannetta Sender, MD  April 15, 2020     Voice recognition software was used for this dictation.  Snyder attempt was made to edit but small errors may persist.  Please call me directly if any concerns arise.

## 2020-04-17 ENCOUNTER — Encounter
Admit: 2020-04-17 | Discharge: 2020-04-17 | Payer: BC Managed Care – PPO | Primary: Student in an Organized Health Care Education/Training Program

## 2020-04-17 NOTE — Telephone Encounter
Pt called back and left vxm to schedule depo appts for herself and her daughters.     Rtd call, scheduled pt and her daughters for depo injections 12/30 at 2pm. Pt and daughters outside of window and will need UPT, pt v/u.

## 2020-04-17 NOTE — Telephone Encounter
Pt left vxm wanting to make depo appts for herself and her two daughters.    Rtd call, no answer, left vxm for pt to call again to schedule appts.

## 2020-04-18 ENCOUNTER — Ambulatory Visit
Admit: 2020-04-18 | Discharge: 2020-04-19 | Payer: BC Managed Care – PPO | Primary: Student in an Organized Health Care Education/Training Program

## 2020-04-18 ENCOUNTER — Encounter
Admit: 2020-04-18 | Discharge: 2020-04-18 | Payer: BC Managed Care – PPO | Primary: Student in an Organized Health Care Education/Training Program

## 2020-04-18 MED ORDER — MEDROXYPROGESTERONE 150 MG/ML IM SYRG
150 mg | Freq: Once | INTRAMUSCULAR | 0 refills | Status: CP
Start: 2020-04-18 — End: ?
  Administered 2020-04-18: 20:00:00 150 mg via INTRAMUSCULAR

## 2020-04-18 NOTE — Progress Notes
Pt presents for depo injection, within 15 week window. Administered in left gluteal, pt tolerated well. Adv pt to return for next injection between 3/17 and 3/31. Pt v/u.

## 2020-04-19 DIAGNOSIS — Z3042 Encounter for surveillance of injectable contraceptive: Principal | ICD-10-CM

## 2020-04-22 ENCOUNTER — Encounter
Admit: 2020-04-22 | Discharge: 2020-04-22 | Payer: BC Managed Care – PPO | Primary: Student in an Organized Health Care Education/Training Program

## 2020-04-22 NOTE — Telephone Encounter
Family Medical Leave Act Paperwork received, completed, and signed.   Copy faxed to the requested location at [facility] (Fax # : [phone] & [phone] ).   Fax confirmation received.   Completed copy sent to medical records for scanning.

## 2020-04-23 ENCOUNTER — Encounter
Admit: 2020-04-23 | Discharge: 2020-04-23 | Payer: BC Managed Care – PPO | Primary: Student in an Organized Health Care Education/Training Program

## 2020-04-24 ENCOUNTER — Encounter
Admit: 2020-04-24 | Discharge: 2020-04-24 | Payer: BC Managed Care – PPO | Primary: Student in an Organized Health Care Education/Training Program

## 2020-04-25 ENCOUNTER — Encounter
Admit: 2020-04-25 | Discharge: 2020-04-25 | Payer: BC Managed Care – PPO | Primary: Student in an Organized Health Care Education/Training Program

## 2020-04-26 ENCOUNTER — Encounter
Admit: 2020-04-08 | Discharge: 2020-04-08 | Payer: BC Managed Care – PPO | Primary: Student in an Organized Health Care Education/Training Program

## 2020-04-26 ENCOUNTER — Encounter
Admit: 2020-04-26 | Discharge: 2020-04-26 | Payer: BC Managed Care – PPO | Primary: Student in an Organized Health Care Education/Training Program

## 2020-04-26 DIAGNOSIS — N83209 Unspecified ovarian cyst, unspecified side: Secondary | ICD-10-CM

## 2020-04-26 DIAGNOSIS — R6889 Other general symptoms and signs: Secondary | ICD-10-CM

## 2020-04-26 DIAGNOSIS — D219 Benign neoplasm of connective and other soft tissue, unspecified: Secondary | ICD-10-CM

## 2020-04-26 MED ORDER — PROPOFOL INJ 10 MG/ML IV VIAL
INTRAVENOUS | 0 refills | Status: DC
Start: 2020-04-26 — End: 2020-04-26

## 2020-04-26 MED ORDER — LIDOCAINE (PF) 200 MG/10 ML (2 %) IJ SYRG
INTRAVENOUS | 0 refills | Status: DC
Start: 2020-04-26 — End: 2020-04-26

## 2020-04-26 MED ORDER — ONDANSETRON HCL (PF) 4 MG/2 ML IJ SOLN
INTRAVENOUS | 0 refills | Status: DC
Start: 2020-04-26 — End: 2020-04-26

## 2020-04-26 MED ORDER — FENTANYL CITRATE (PF) 50 MCG/ML IJ SOLN
INTRAVENOUS | 0 refills | Status: DC
Start: 2020-04-26 — End: 2020-04-26

## 2020-04-26 MED ORDER — MIDAZOLAM 1 MG/ML IJ SOLN
INTRAVENOUS | 0 refills | Status: DC
Start: 2020-04-26 — End: 2020-04-26

## 2020-04-26 MED ORDER — ROCURONIUM 10 MG/ML IV SOLN
INTRAVENOUS | 0 refills | Status: DC
Start: 2020-04-26 — End: 2020-04-26

## 2020-04-26 MED ORDER — DEXAMETHASONE SODIUM PHOSPHATE 4 MG/ML IJ SOLN
INTRAVENOUS | 0 refills | Status: DC
Start: 2020-04-26 — End: 2020-04-26

## 2020-04-26 MED ORDER — SUGAMMADEX 100 MG/ML IV SOLN
INTRAVENOUS | 0 refills | Status: DC
Start: 2020-04-26 — End: 2020-04-26

## 2020-04-26 MED FILL — OXYCODONE-ACETAMINOPHEN 5-325 MG PO TAB: 5/325 mg | ORAL | 4 days supply | Qty: 20 | Fill #1 | Status: CP

## 2020-05-09 ENCOUNTER — Encounter
Admit: 2020-05-09 | Discharge: 2020-05-09 | Payer: BC Managed Care – PPO | Primary: Student in an Organized Health Care Education/Training Program

## 2020-05-09 ENCOUNTER — Ambulatory Visit
Admit: 2020-05-09 | Discharge: 2020-05-09 | Payer: BC Managed Care – PPO | Primary: Student in an Organized Health Care Education/Training Program

## 2020-05-09 DIAGNOSIS — N83209 Unspecified ovarian cyst, unspecified side: Secondary | ICD-10-CM

## 2020-05-09 DIAGNOSIS — D219 Benign neoplasm of connective and other soft tissue, unspecified: Secondary | ICD-10-CM

## 2020-05-09 DIAGNOSIS — Z9889 Other specified postprocedural states: Secondary | ICD-10-CM

## 2020-05-09 DIAGNOSIS — R6889 Other general symptoms and signs: Secondary | ICD-10-CM

## 2020-05-09 NOTE — Progress Notes
Subjective:      Cindy Snyder returns to clinic today for post op visit after undergoing a laparoscopic cholecystectomy on 04/26/2020.  Patient reports that she is doing well overall.  Tolerating diet.  Denies fever or chills. She has normal bowel function and is not taking any pain medication.  She denies any incisional issues.    Objective:     BP 112/51 (BP Source: Arm, Left Upper, Patient Position: Sitting)  - Pulse 57  - Temp 36.5 C (97.7 F)  - Resp 14  - Ht 167.6 cm (66")  - Wt 82.6 kg (182 lb)  - LMP 12/25/2019 Comment: approx - SpO2 100%  - BMI 29.38 kg/m     General:  alert and no distress   Incision:   healing well, no drainage, no erythema, no hernia, no seroma, no swelling         Final Diagnosis:     A. Gallbladder and lymph node (1), "gallbladder", cholecystectomy:   Chronic cholecystitis and cholelithiasis.   One benign lymph node.       Assessment:     S/P laparoscopic cholecystectomy on 04/26/2020     Plan:     1. Instructed on incision care.  OK to submerge in water.  2. Discussed increasing activities to return to normal, letting her body be the guide.  3. RTC PRN  4. Discussed pathology as above.  No questions or concerns.     Nickola Major, APRN-BC  Pager [phone]    [facility]  Department of [place]  [address]  [place], [place]  [address]

## 2020-05-21 ENCOUNTER — Encounter
Admit: 2020-05-21 | Discharge: 2020-05-21 | Payer: BC Managed Care – PPO | Primary: Student in an Organized Health Care Education/Training Program

## 2020-05-21 ENCOUNTER — Ambulatory Visit
Admit: 2020-05-21 | Discharge: 2020-05-21 | Payer: BC Managed Care – PPO | Primary: Student in an Organized Health Care Education/Training Program

## 2020-05-21 DIAGNOSIS — H9312 Tinnitus, left ear: Secondary | ICD-10-CM

## 2020-05-21 MED ORDER — GADOBENATE DIMEGLUMINE 529 MG/ML (0.1MMOL/0.2ML) IV SOLN
15 mL | Freq: Once | INTRAVENOUS | 0 refills | Status: CP
Start: 2020-05-21 — End: ?
  Administered 2020-05-21: 21:00:00 15 mL via INTRAVENOUS

## 2020-07-04 ENCOUNTER — Ambulatory Visit
Admit: 2020-07-04 | Discharge: 2020-07-05 | Payer: BC Managed Care – PPO | Primary: Student in an Organized Health Care Education/Training Program

## 2020-07-04 ENCOUNTER — Encounter
Admit: 2020-07-04 | Discharge: 2020-07-04 | Payer: BC Managed Care – PPO | Primary: Student in an Organized Health Care Education/Training Program

## 2020-07-04 DIAGNOSIS — Z3042 Encounter for surveillance of injectable contraceptive: Secondary | ICD-10-CM

## 2020-07-04 MED ORDER — MEDROXYPROGESTERONE 150 MG/ML IM SYRG
150 mg | Freq: Once | INTRAMUSCULAR | 0 refills | Status: CP
Start: 2020-07-04 — End: ?

## 2020-07-04 NOTE — Progress Notes
Pt arrived for depo injection. Las injection 12/30 so within 15 week window to receive this injection. Injection given (see eMAR). Next injection due between 6/2-6/16. Per pt will schedule for 6/2 at 1430.

## 2020-07-19 ENCOUNTER — Encounter
Admit: 2020-07-19 | Discharge: 2020-07-19 | Payer: BC Managed Care – PPO | Primary: Student in an Organized Health Care Education/Training Program

## 2020-08-01 ENCOUNTER — Encounter
Admit: 2020-08-01 | Discharge: 2020-08-01 | Payer: BC Managed Care – PPO | Primary: Student in an Organized Health Care Education/Training Program

## 2020-09-19 ENCOUNTER — Ambulatory Visit
Admit: 2020-09-19 | Discharge: 2020-09-20 | Payer: BC Managed Care – PPO | Primary: Student in an Organized Health Care Education/Training Program

## 2020-09-19 ENCOUNTER — Encounter
Admit: 2020-09-19 | Discharge: 2020-09-19 | Payer: BC Managed Care – PPO | Primary: Student in an Organized Health Care Education/Training Program

## 2020-09-19 DIAGNOSIS — Z3042 Encounter for surveillance of injectable contraceptive: Secondary | ICD-10-CM

## 2020-09-19 MED ORDER — MEDROXYPROGESTERONE 150 MG/ML IM SYRG
150 mg | Freq: Once | INTRAMUSCULAR | 0 refills | Status: CP
Start: 2020-09-19 — End: ?
  Administered 2020-09-19: 19:00:00 150 mg via INTRAMUSCULAR

## 2020-09-19 NOTE — Progress Notes
Patient arrived in clinic for Depo injection. Depo given in right gluteal. Patient tolerated injection without complication. Next due 8/18-9/1.

## 2020-12-05 ENCOUNTER — Ambulatory Visit
Admit: 2020-12-05 | Discharge: 2020-12-05 | Payer: BC Managed Care – PPO | Primary: Student in an Organized Health Care Education/Training Program

## 2020-12-05 ENCOUNTER — Encounter
Admit: 2020-12-05 | Discharge: 2020-12-05 | Payer: BC Managed Care – PPO | Primary: Student in an Organized Health Care Education/Training Program

## 2020-12-05 MED ORDER — MEDROXYPROGESTERONE 150 MG/ML IM SYRG
150 mg | Freq: Once | INTRAMUSCULAR | 0 refills | Status: CP
Start: 2020-12-05 — End: ?
  Administered 2020-12-05: 19:00:00 150 mg via INTRAMUSCULAR

## 2020-12-05 NOTE — Progress Notes
Date last Pap smear: 04/08/21.  Last Depo-Provera: 09/19/20.  Side Effects if any: None.  Serum HCG indicated? No.  Depo-Provera 150 mg IM given by: Cruzita Lederer, RN BSN.  Next appointment due 02/20/21.

## 2021-01-09 ENCOUNTER — Encounter
Admit: 2021-01-09 | Discharge: 2021-01-09 | Payer: BC Managed Care – PPO | Primary: Student in an Organized Health Care Education/Training Program

## 2021-02-17 ENCOUNTER — Encounter
Admit: 2021-02-17 | Discharge: 2021-02-17 | Payer: BC Managed Care – PPO | Primary: Student in an Organized Health Care Education/Training Program

## 2021-02-17 ENCOUNTER — Ambulatory Visit
Admit: 2021-02-17 | Discharge: 2021-02-17 | Payer: BC Managed Care – PPO | Primary: Student in an Organized Health Care Education/Training Program

## 2021-02-17 DIAGNOSIS — R6889 Other general symptoms and signs: Secondary | ICD-10-CM

## 2021-02-17 DIAGNOSIS — N83209 Unspecified ovarian cyst, unspecified side: Secondary | ICD-10-CM

## 2021-02-17 DIAGNOSIS — R1010 Upper abdominal pain, unspecified: Secondary | ICD-10-CM

## 2021-02-17 DIAGNOSIS — D219 Benign neoplasm of connective and other soft tissue, unspecified: Secondary | ICD-10-CM

## 2021-02-17 LAB — COMPREHENSIVE METABOLIC PANEL
BLD UREA NITROGEN: 8 mg/dL (ref 7–25)
CALCIUM: 9.2 mg/dL (ref 8.5–10.6)
CREATININE: 0.6 mg/dL (ref 0.4–1.00)
GLUCOSE,PANEL: 72 mg/dL (ref 70–100)
POTASSIUM: 3.8 MMOL/L (ref 3.5–5.1)
SODIUM: 140 MMOL/L (ref 137–147)

## 2021-02-17 LAB — GGTP: GGTP: 16 U/L (ref 9–64)

## 2021-02-17 LAB — CBC
RBC COUNT: 4.6 M/UL (ref 4.0–5.0)
WBC COUNT: 3 K/UL — ABNORMAL LOW (ref 4.5–11.0)

## 2021-02-17 LAB — LIPASE: LIPASE: 22 U/L — ABNORMAL LOW (ref 11–82)

## 2021-02-17 MED ORDER — PANTOPRAZOLE 40 MG PO TBEC
40 mg | ORAL_TABLET | Freq: Two times a day (BID) | ORAL | 0 refills | 90.00000 days | Status: AC
Start: 2021-02-17 — End: ?
  Filled 2021-02-21: qty 90, 45d supply, fill #1

## 2021-02-17 NOTE — Progress Notes
Family Medicine Clinic Visit Note     Encounter Date: 02/17/2021   Encounter Provider Karie Mainland, MD   Primary Care Physician:  Wannetta Sender, MD (General)     SUBJECTIVE     Chief Complaint   Patient presents with   ? Abdominal pain     Resembles the pain she had when she had gallbladder removed       History of Present Illness     Cindy Snyder is a 45 y.o. female seen in [facility] Medicine clinic for had concerns including Abdominal pain (Resembles the pain she had when she had gallbladder removed). Cindy Snyder  has a past medical history of Fibroids, Ovarian cyst, and Unspecified problems with internal organs.    1. Pain of upper abdomen  More of an acute issue, has worsened in the last few weeks  Status post gallbladder removal in January  Pain feels fairly similar, right upper quadrant and epigastric  Has had diarrhea the last 2 days, intermittently has diarrhea, but not all the time  Reports stool looks about the same to her baseline  May be associated with meals?  She is not totally sure  Got so bad yesterday that she was bent over and feeling nauseous, had to call nurse triage line  No known history of ulcers  Pain comes and goes    Answers for HPI/ROS submitted by the patient on 02/17/2021  Chronicity: recurrent  Onset: yesterday  Onset quality: sudden  Frequency: 2 to 4 times per day  Episode duration: 20 Hours  Progression since onset: waxing and waning  Pain location: epigastric region  Pain - numeric: 4/10  Pain quality: aching, cramping, sharp  Radiates to: does not radiate  anorexia: Yes  arthralgias: No  belching: Yes  constipation: No  diarrhea: Yes  dysuria: No  fever: No  flatus: Yes  frequency: No  headaches: Yes  hematochezia: No  hematuria: No  melena: No  myalgias: No  nausea: Yes  weight loss: No  vomiting: Yes  Aggravated by: nothing  Relieved by: belching, certain positions, vomiting      ASSESSMENT & PLAN     Cindy Snyder is a 45 y.o. female seen today in clinic for:     1. Pain of upper abdomen  Acute issue that is new to me today.  Etiology of upper abdominal pain unclear, differential would include postcholecystectomy syndrome, center of Oddi dysfunction, liver abnormality (less likely), ulcer/H. pylori, versus other.  Labs as below to further evaluate for infectious causes and/or organ dysfunction.  H. pylori feces test as well.  Will trial high-dose twice daily dosing of Protonix as diagnostic/therapeutic approach--patient will message/call clinic in 3 weeks to let us know how she is doing.  Would like to see her back in 6 weeks for further evaluation, at that time if symptoms have persisted could consider EGD and/or referral to GI for HIDA/ERCP.  Additionally, could consider CT abdomen/pelvis at that time.    - CBC; Future  - COMPREHENSIVE METABOLIC PANEL; Future  - LIPASE; Future  - GGTP; Future  - H. PYLORI AG, FECES; Future  - pantoprazole DR (PROTONIX) 40 mg tablet; Take one tablet by mouth twice daily for 90 days.  Dispense: 90 tablet; Refill: 0      Return precautions discussed, questions sought and answered. Patient expressed understanding and agreed with treatment plan.     Disposition: Return in about 6 weeks (around 03/31/2021) for In-Person abdominal pain follow up .  Patient was reviewed and discussed with Wray Kearns, MD.       Karie Mainland, MD  Resident Physician PGY-3  Family Medicine Residency  [facility]     SUBJECTIVE Continued     Review of Symptoms     ROS negative unless discussed in HPI      OBJECTIVE     Vitals:    02/17/21 1319   BP: 111/74   BP Source: Arm, Left Upper   Pulse: 56   Temp: 36.9 ?C (98.5 ?F)   SpO2: 98%   TempSrc: Skin   PainSc: Two   Weight: 82.3 kg (181 lb 6.4 oz)   Height: 167.6 cm (5' 6)     Wt Readings from Last 4 Encounters:   02/17/21 82.3 kg (181 lb 6.4 oz)   05/09/20 82.6 kg (182 lb)   04/26/20 82.1 kg (181 lb)   04/15/20 82.6 kg (182 lb)       Medications     ? diclofenac (VOLTAREN) 1 % topical gel Apply four g topically to affected area four times daily.   ? fexofenadine (ALLEGRA) 60 mg tablet Take 60 mg by mouth twice daily.   ? fluticasone propionate (FLONASE) 50 mcg/actuation nasal spray, suspension Apply 1 spray into nose as directed twice daily.   ? medroxyprogesterone acetate (DEPO-PROVERA IM) Inject 1 Dose into the muscle every 90 days.   ? pantoprazole DR (PROTONIX) 40 mg tablet Take one tablet by mouth twice daily for 90 days.   ? simethicone (GAS RELIEF (SIMETHICONE)) 80 mg chew tablet Chew one tablet by mouth every 6 hours as needed for Flatulence.       PHYSICAL EXAM     Physical Exam  HENT:      Head: Normocephalic and atraumatic.      Right Ear: External ear normal.      Left Ear: External ear normal.      Nose: Nose normal.   Eyes:      Extraocular Movements: Extraocular movements intact.   Pulmonary:      Effort: Pulmonary effort is normal. No respiratory distress.   Abdominal:      General: Abdomen is flat. Bowel sounds are normal. There is no distension.      Palpations: Abdomen is soft. There is no mass.      Tenderness: There is no abdominal tenderness. There is no guarding or rebound.   Neurological:      Mental Status: She is alert and oriented to person, place, and time.   Psychiatric:         Mood and Affect: Mood normal.           Karie Mainland, MD

## 2021-02-17 NOTE — Telephone Encounter
Pt. requesting to schedule an appointment with PCP. States she is having symptoms where her gall bladder was removed. LVM [id number]

## 2021-02-17 NOTE — Telephone Encounter
Pt spoke w/ PSR earlier, and scheduled for soonest available on 11/21 at 9:20 w/ Dr. Glennon Mac. Pt needing to be seen sooner. RT to RN. Pt experiencing stomach cramping and vomiting as reported to [facility], not sure why it wasn't left on VM.

## 2021-02-17 NOTE — Progress Notes
The patient was discussed with the resident at the time of the visit.  Agree with the resident's assessment and plan as documented.

## 2021-02-17 NOTE — Telephone Encounter
Pc to pt, states abdominal cramping and vomiting starting on 02/16/21 no fever, but same symptoms as prior to having gallbladder out. Pt states she has abdominal pain and curls up in a ball, sweats, and then had diarrhea, took gas x and it made her belch but still having abdominal pain, pt denies taking pain meds as it happened so fast. Pt could not work this day do to pain that is in the middle of abdomen.     RN scheduled pt for this day. Pt. agreeable and verbalizes understanding.    Reason for Disposition  ? SEVERE abdominal pain (e.g., excruciating)    Answer Assessment - Initial Assessment Questions  1. LOCATION: Where does it hurt?       Center/upper abdomen.   2. RADIATION: Does the pain shoot anywhere else? (e.g., chest, back)      No  3. ONSET: When did the pain begin? (e.g., minutes, hours or days ago)       02/16/21  4. SUDDEN: Gradual or sudden onset?      Sudden  5. PATTERN Does the pain come and go, or is it constant?     - If constant: Is it getting better, staying the same, or worsening?       (Note: Constant means the pain never goes away completely; most serious pain is constant and it progresses)      - If intermittent: How long does it last? Do you have pain now?      (Note: Intermittent means the pain goes away completely between bouts)      Constant.   6. SEVERITY: How bad is the pain?  (e.g., Scale 1-10; mild, moderate, or severe)     - MILD (1-3): doesn't interfere with normal activities, abdomen soft and not tender to touch      - MODERATE (4-7): interferes with normal activities or awakens from sleep, abdomen tender to touch      - SEVERE (8-10): excruciating pain, doubled over, unable to do any normal activities        Severe.   7. RECURRENT SYMPTOM: Have you ever had this type of stomach pain before? If Yes, ask: When was the last time? and What happened that time?       Yes, prior to having Gallbladder removed.   8. AGGRAVATING FACTORS: Does anything seem to cause this pain? (e.g., foods, stress, alcohol)      Unsure.   9. CARDIAC SYMPTOMS: Do you have any of the following symptoms: chest pain, difficulty breathing, sweating, nausea?      SOB when having pain, pt able to speak in sentences, pain has more subsided, not SOB at this time.   10. OTHER SYMPTOMS: Do you have any other symptoms? (e.g., back pain, diarrhea, fever, urination pain, vomiting)        Vomiting, pain, diarrhea (x1), sweating.   11. PREGNANCY: Is there any chance you are pregnant? When was your last menstrual period?        NA    Protocols used: ABDOMINAL PAIN - UPPER-A-OH

## 2021-02-20 ENCOUNTER — Encounter
Admit: 2021-02-20 | Discharge: 2021-02-20 | Payer: BC Managed Care – PPO | Primary: Student in an Organized Health Care Education/Training Program

## 2021-02-20 ENCOUNTER — Ambulatory Visit
Admit: 2021-02-20 | Discharge: 2021-02-20 | Payer: BC Managed Care – PPO | Primary: Student in an Organized Health Care Education/Training Program

## 2021-02-20 DIAGNOSIS — Z3042 Encounter for surveillance of injectable contraceptive: Principal | ICD-10-CM

## 2021-02-20 MED ORDER — MEDROXYPROGESTERONE 150 MG/ML IM SYRG
150 mg | Freq: Once | INTRAMUSCULAR | 0 refills | Status: CP
Start: 2021-02-20 — End: ?

## 2021-02-20 NOTE — Telephone Encounter
Pt. states she was sen on Monday and was told she had to pick her medication up at [place] [facility] and not her preferred pharmacy. Requesting a call back. LVM [id number]

## 2021-02-20 NOTE — Telephone Encounter
Returned PC to pt. LVM with callback number.

## 2021-02-20 NOTE — Progress Notes
Patient arrived in clinic for Depo injection. Depo given in right gluteal. Patient tolerated injection without complication. Next due 1/19 - 2/2.

## 2021-02-21 ENCOUNTER — Encounter
Admit: 2021-02-21 | Discharge: 2021-02-21 | Payer: BC Managed Care – PPO | Primary: Student in an Organized Health Care Education/Training Program

## 2021-02-23 ENCOUNTER — Encounter
Admit: 2021-02-23 | Discharge: 2021-02-23 | Payer: BC Managed Care – PPO | Primary: Student in an Organized Health Care Education/Training Program

## 2021-03-04 ENCOUNTER — Encounter
Admit: 2021-03-04 | Discharge: 2021-03-04 | Payer: BC Managed Care – PPO | Primary: Student in an Organized Health Care Education/Training Program

## 2021-03-10 ENCOUNTER — Encounter
Admit: 2021-03-10 | Discharge: 2021-03-10 | Payer: BC Managed Care – PPO | Primary: Student in an Organized Health Care Education/Training Program

## 2021-03-10 ENCOUNTER — Ambulatory Visit
Admit: 2021-03-10 | Discharge: 2021-03-10 | Payer: BC Managed Care – PPO | Primary: Student in an Organized Health Care Education/Training Program

## 2021-03-10 DIAGNOSIS — R1013 Epigastric pain: Secondary | ICD-10-CM

## 2021-03-10 DIAGNOSIS — D219 Benign neoplasm of connective and other soft tissue, unspecified: Secondary | ICD-10-CM

## 2021-03-10 DIAGNOSIS — N83209 Unspecified ovarian cyst, unspecified side: Secondary | ICD-10-CM

## 2021-03-10 DIAGNOSIS — R6889 Other general symptoms and signs: Secondary | ICD-10-CM

## 2021-03-10 DIAGNOSIS — Z1231 Encounter for screening mammogram for malignant neoplasm of breast: Secondary | ICD-10-CM

## 2021-03-10 NOTE — Progress Notes
Note to patient: The 21st Century Cures Act makes medical notes like these available to patients in the interest of transparency. However, be advised this is a medical document. It is intended as peer to peer communication. It is written in medical language and may contain abbreviations or verbiage that are unfamiliar. It may appear blunt or direct. Medical documents are intended to carry relevant information, facts as evident, and the clinical opinion of the practitioner    Date of Service: 03/10/2021    Cindy Snyder is a 45 y.o. female.  DOB: 22-Oct-1975  MRN: [id number]     Subjective:       History of Present Illness  Cindy Snyder is a 45 y.o. female here for abdominal pain and vomiting follow up. Seen by Dr Kenyon Ana three weeks ago. Workup started normal CMP, no pancreatitis, normal GGT.  Trial of PPI started at that time. Now vomiting is improved. Pain is so intermittent it is difficult to say if the PPI is helping.  Had cholecystectomy in Jan 2022. Pain is epigastric severe cramping pain. Feels just like prior gallbladder pain. GasX not helping. Notices constipation on Sunday and Monday. Does not have BMs at home on the weekends, so was not able to leave a stool sample for H pylori testing.    ROS:  No blood in stool, no weight loss, no fever or chills, no change in appetite, +early satiety           PMHx:  Medical History:   Diagnosis Date   ? Fibroids    ? Ovarian cyst    ? Unspecified problems with internal organs        SurgHx:  Surgical History:   Procedure Laterality Date   ? LAPAROSCOPIC CHOLECYSTECTOMY N/A 04/26/2020    Performed by Flo Shanks, Alecia Lemming., MD at [facility] OR       SocHx:  Social History     Socioeconomic History   ? Marital status: Separated   ? Number of children: 10   Occupational History     Employer: [employer]   Tobacco Use   ? Smoking status: Never   ? Smokeless tobacco: Never   Vaping Use   ? Vaping Use: Never used   Substance and Sexual Activity   ? Alcohol use: No   ? Drug use: No   ? Sexual activity: Yes     Partners: Male     Birth control/protection: Condom, Injection   Social History Narrative    Lives with 4 daughters. No pets.           FamHx:    Family History   Problem Relation Age of Onset   ? High Cholesterol Mother    ? Hearing Loss Mother    ? Learning Disability Mother    ? Asthma Brother    ? Asthma Maternal Grandmother    ? Cancer-Lung Maternal Grandmother    ? Cancer-Prostate Maternal Grandfather    ? Arthritis Neg Hx    ? Bleeding Disorders Neg Hx    ? Cancer-Colon Neg Hx    ? Cancer-Breast Neg Hx    ? Cancer-Ovarian Neg Hx    ? Cancer-Uterine Neg Hx    ? Cleft Lip Neg Hx    ? Cervical Cancer Neg Hx    ? Cleft Palate Neg Hx    ? Diabetes Neg Hx    ? Heart Disease Neg Hx    ? Migraines Neg Hx    ?  Miscarriages Neg Hx    ? Rashes/Skin Problems Neg Hx    ? Seizures Neg Hx    ? Thyroid Disease Neg Hx    ? VTE Neg Hx    ? Unknown to Patient Neg Hx    ? Blindness Neg Hx    ? Cataract Neg Hx    ? Glaucoma Neg Hx    ? Macular Degen Neg Hx    ? Retinal Detachment Neg Hx           Objective:         ? diclofenac (VOLTAREN) 1 % topical gel Apply four g topically to affected area four times daily.   ? fexofenadine (ALLEGRA) 60 mg tablet Take 60 mg by mouth twice daily.   ? fluticasone propionate (FLONASE) 50 mcg/actuation nasal spray, suspension Apply 1 spray into nose as directed twice daily.   ? medroxyprogesterone acetate (DEPO-PROVERA IM) Inject 1 Dose into the muscle every 90 days.   ? pantoprazole DR (PROTONIX) 40 mg tablet Take one tablet by mouth twice daily   ? simethicone (GAS RELIEF (SIMETHICONE)) 80 mg chew tablet Chew one tablet by mouth every 6 hours as needed for Flatulence.     Allergies:   Allergies as of 03/10/2021   ? (No Known Allergies)       Vitals:    03/10/21 0927   BP: 117/63   Pulse: 53   Temp: 36.8 ?C (98.2 ?F)   SpO2: 100%   TempSrc: Skin   PainSc: Five   Weight: 83.9 kg (185 lb)   Height: 167.6 cm (5' 6)     Body mass index is 29.86 kg/m?Marland Kitchen     Physical Exam    Gen: alert, well appearing  CV: well perfused, no edema  Resp: normal respiratory effort, no distress  Skin: warm, dry  GI: +BS, soft, tender to palpation in epigastrum and LUQ, no rebound or guarding, no masses       Assessment and Plan:    1. Epigastric abdominal pain- new problem with unclear etiology, broad differential including peptic ulcer, duodenal ulcer, H pylori, splenic source. Reviewed lab work ordered at last visit by Dr Kenyon Ana, normal CMP and CBC, no pancreatitis. Given early satiety, will send for EGD. Since now on PPI, H pylori testing will not be accurate. Defer at this time and biopsies can be done with EGD if findings are suspicious.  - CT ABDOMEN W CONTRAST; Future  - AMB REFERRAL TO GI LAB FOR PROCEDURE  - continue PPI for full six week trial  - follow up in 4-6 weeks for reevaluation and review of imaging and scope      Future Appointments   Date Time Provider Department Center   03/10/2021  3:45 PM M[facility]MO - [facility] ROOM 1 [id number] Radiolog            Janan Halter, MD  March 10, 2021

## 2021-03-10 NOTE — Pre-Anesthesia Patient Instructions
UPPER GI ENDOSCOPY   INSTRUCTIONS      Azaylea,     You are scheduled for an EGD on: 03/18/2021  at:  7:30 AM   With Dr. Jean Rosenthal, Lindon Romp, DO  You must arrive by:  6:30 am     ** Please keep in mind that our surgery schedule is fluid, and affected by multiple factors, so it is possible that your procedure time could change.  We will make every effort to maintain your original procedure time, but there are no guarantees.  If your time changes, you could receive a call notifying you up until the afternoon prior to your procedure. Thank you for your patience and understanding!      **Please note the Ambulatory Surgery Center doors open at 6:00AM. If you arrive prior to 6AM, you will need to wait in your vehicle.     OUR ADDRESS IS [address] [place] [address]  [address] TO [address] AND TURN LEFT.   TAKE A LEFT AT THE FIRST OR SECOND ENTRANCE AND FOLLOW SIGNAGE TO THE AMBULATORY SURGERY CENTER ([address]).    To Reschedule call: [phone]  For Pre-Procedure or prep Questions call: PAT Phone #s: [phone]  For questions on evenings/weekends, please call the [facility] after hours number [phone] and ask for the GI physician on-call.   [facility] main number: [phone] (receptionist, billing, insurance)    To notify us of an AFTER HOURS cancellation (during the weekend or night prior to procedure), please leave a voicemail at [phone] stating that you need to cancel.    ____________________________________________________________    Upper GI endoscopy allows healthcare providers to look directly into the beginning of your gastrointestinal(GI) tract.  The esophagus, stomach, and duodenum (first part of the small intestine) make up the upper GI tract.  - The doctor will speak to you before and after the procedure.  -- Unless you request otherwise, we will invite your driver to the recovery room to hear the provider's post-exam report. Please make your driver aware that the temperature of the lobby and recovery areas are often kept cool and we advise dressing warm or bringing a jacket.   _____________________________________________________________________________________________________    5 DAYS PRIOR:  Check with your prescribing physician for instructions about stopping your blood thinner.  Examples of blood thinners are Aleve, Aspirin, Coumadin, Eliquis, Ibuprofen, Naproxen, Plavix and Xarelto. YOU MAY CONTINUE TO USE TYLENOL (ACETAMINOPHEN).   Do not give yourself a Lovenox injection the morning of the test.  Lovenox injections may be taken as usual the day before the test.    DAY OF TEST:  You should have nothing to eat after midnight prior to your procedure.  You may drink clear liquids (water, tea, gatorade, or black coffee no cream or additives) until (4) hours before your scheduled procedure time. After this you should have nothing by mouth. This includes GUM or CANDY.   Chewing tobacco must be stopped (6) hours before your scheduled procedure.   If you have an early morning test, take ONLY your essential morning medications (heart, blood pressure, seizure, etc.) with a small sip of water.  Bathe, brush teeth and gargle the morning of your procedure.   Wear comfortable, casual, loose fitting clothing that are easy to get on and off.  Please bring your cell phone with you.  Take off any jewelry, take out any piercings, and leave all other valuables at home.  If you wear  glasses or contacts, please bring a case for their safekeeping.   You are scheduled to receive anesthesia with your procedure/surgery. For your safety, and the safety of others, a responsible adult (over the age of 52) is required to drive you home from your procedure and stay with you for 12 hours. We ask that your caregiver remain in or near the facility for the duration of your procedure. If your caregiver does not remain in the facility, we require a working mobile phone number and, in some instances, the facility may contact your driver for pick-up confirmation before your procedure can begin. If you choose to use an alternate method of transportation such as an Pharmacist, community or taxi, a responsible adult is still required to accompany you to and from the facility. If you do not have someone to accompany you to and from our facility, we will be unable to complete your procedure/surgery.  You will be here for (2-3) hours from arrival time.   You will not be able to return to work the same day if you have received sedation.   Please bring a list of your current medications and dosages with you. Please do not bring your pills or medication containers to your procedure.  Please bring a copy of your ID and insurance card along with any required copay/deductible    ____________________________________________________________________________________________________________________    KEEPING YOU SAFE    ** THE HEALTH AND SAFETY OF OUR PATIENTS, STAFF AND PHYSICIANS IS OUR TOP PRIORITY. TO REDUCE THE RISK OF POSSIBLE COVID-19 EXPOSURE WE ARE TAKING THE PROPER PRECAUTIONS LISTED BELOW    YOU ARE PERMITTED 1 VISITOR IN THE WAITING AREA.    ALL PATIENTS AND THEIR RESPONSIBLE ADULT (IF ENTERING THE FACILITY) WILL BE SCREENED ON THE DATE OF SERVICE UPON ARRIVAL AND BE REQUIRED TO WEAR A MASK WHILE INSIDE THE FACILITY. NO VISITORS UNDER THE AGE OF 18 WILL BE ALLOWED IN THE RECOVERY AREA.     NOTIFY YOUR SURGEON IF YOU HAVE ANY SIGNIFICANT HEALTH STATUS CHANGES OR SHOULD YOU BECOME ILL PRIOR TO SURGERY WITH FEVER (TEMP 100.4 FAHRENHEIT OR GREATER) AND/OR COUGH, DIFFICULTY BREATHING, CHILLS, MUSCLE PAIN, HEADACHE, SORE THROAT OR NEW ONSET LOSS OF TASTE OR SMELL, OR NEW SUSPECTED EXPOSURE TO COVID-19.    ___________________________________________________________________________________            Pre-Admissions Testing (PAT)  [facility]  [email or web address]  [address] [place], [place] [address]  [phone] (Main)    PAT Phone #s: [phone] [email or web address]  An Affiliate of [facility]

## 2021-03-11 ENCOUNTER — Encounter
Admit: 2021-03-11 | Discharge: 2021-03-11 | Payer: BC Managed Care – PPO | Primary: Student in an Organized Health Care Education/Training Program

## 2021-04-04 ENCOUNTER — Encounter
Admit: 2021-04-04 | Discharge: 2021-04-04 | Payer: BC Managed Care – PPO | Primary: Student in an Organized Health Care Education/Training Program

## 2021-04-04 ENCOUNTER — Ambulatory Visit
Admit: 2021-04-04 | Discharge: 2021-04-04 | Payer: BC Managed Care – PPO | Primary: Student in an Organized Health Care Education/Training Program

## 2021-04-04 DIAGNOSIS — R1013 Epigastric pain: Secondary | ICD-10-CM

## 2021-04-04 MED ORDER — IOHEXOL 350 MG IODINE/ML IV SOLN
80 mL | Freq: Once | INTRAVENOUS | 0 refills | Status: CP
Start: 2021-04-04 — End: ?
  Administered 2021-04-04: 14:00:00 80 mL via INTRAVENOUS

## 2021-04-04 MED ORDER — SODIUM CHLORIDE 0.9 % IJ SOLN
50 mL | Freq: Once | INTRAVENOUS | 0 refills | Status: CP
Start: 2021-04-04 — End: ?
  Administered 2021-04-04: 14:00:00 50 mL via INTRAVENOUS

## 2021-04-04 NOTE — Progress Notes
Normal CT abdomen. Fibroids noted. EGD was cancelled, patient advised to reschedule that via [place]. Routing to PCP as FYI.

## 2021-05-13 ENCOUNTER — Encounter
Admit: 2021-05-13 | Discharge: 2021-05-13 | Payer: No Typology Code available for payment source | Primary: Student in an Organized Health Care Education/Training Program

## 2021-05-30 ENCOUNTER — Encounter
Admit: 2021-05-30 | Discharge: 2021-05-30 | Payer: No Typology Code available for payment source | Primary: Student in an Organized Health Care Education/Training Program

## 2021-05-30 NOTE — Telephone Encounter
Mom calling to schedule depo nurse visit. Visit scheduled, educated mom that annual visits must be scheduled to keep receiving depos. Pt is late for depo. Notified UPT will be needed. Mom verbalized understanding.

## 2021-06-06 ENCOUNTER — Ambulatory Visit
Admit: 2021-06-06 | Discharge: 2021-06-07 | Payer: BC Managed Care – PPO | Primary: Student in an Organized Health Care Education/Training Program

## 2021-06-06 ENCOUNTER — Encounter
Admit: 2021-06-06 | Discharge: 2021-06-06 | Payer: No Typology Code available for payment source | Primary: Student in an Organized Health Care Education/Training Program

## 2021-06-06 DIAGNOSIS — Z3042 Encounter for surveillance of injectable contraceptive: Principal | ICD-10-CM

## 2021-06-06 MED ORDER — MEDROXYPROGESTERONE 150 MG/ML IM SYRG
150 mg | Freq: Once | INTRAMUSCULAR | 0 refills | Status: CP
Start: 2021-06-06 — End: ?
  Administered 2021-06-06: 16:00:00 150 mg via INTRAMUSCULAR

## 2021-06-06 NOTE — Progress Notes
Pt here for depo, UPT negative, pt informed that before she can get her next depo she will need a physical, physical scheduled and pt verbalized understanding

## 2021-07-02 ENCOUNTER — Encounter
Admit: 2021-07-02 | Discharge: 2021-07-02 | Payer: No Typology Code available for payment source | Primary: Student in an Organized Health Care Education/Training Program

## 2021-08-05 ENCOUNTER — Encounter
Admit: 2021-08-05 | Discharge: 2021-08-05 | Payer: No Typology Code available for payment source | Primary: Student in an Organized Health Care Education/Training Program

## 2021-08-05 ENCOUNTER — Ambulatory Visit
Admit: 2021-08-05 | Discharge: 2021-08-05 | Payer: No Typology Code available for payment source | Primary: Student in an Organized Health Care Education/Training Program

## 2021-08-05 DIAGNOSIS — N83209 Unspecified ovarian cyst, unspecified side: Secondary | ICD-10-CM

## 2021-08-05 DIAGNOSIS — H5213 Myopia, bilateral: Secondary | ICD-10-CM

## 2021-08-05 DIAGNOSIS — R6889 Other general symptoms and signs: Secondary | ICD-10-CM

## 2021-08-05 DIAGNOSIS — D219 Benign neoplasm of connective and other soft tissue, unspecified: Secondary | ICD-10-CM

## 2021-08-05 NOTE — Progress Notes
Body mass index is 29.86 kg/m.              Assessment and Plan:    Problem   Myopia With Astigmatism and Presbyopia, Bilateral       Myopia with astigmatism and presbyopia, bilateral  Updated glasses prescription given at today's exam   Prefers DVO glasses      Larger c/d ratio -- get baseline OCT nerve next year      Next Optometry visit -- 1 yr  Test  Comments   MRx  _0     Galilei  _1     OCT  _2  Mac  _3  Nerve  _4  Ant seg    TOSM _5     Schirmer  _6  1 Drop of Proparacaine   _7  No numbing drops    IOP  _8  Tono   _9  iCare   _10  Applanate    Pachy  _11     HVF  _12  Sita Fast  _13  24-2       _14  Standard  _15  10-2         Optos Photos  _16         Dilate  _17 1 drop Tropicamide 1% &  Phenylephrine 2.5%   _18  Other (see comments)

## 2021-08-05 NOTE — Assessment & Plan Note
Updated glasses prescription given at today's exam   Prefers DVO glasses

## 2021-08-19 ENCOUNTER — Encounter
Admit: 2021-08-19 | Discharge: 2021-08-19 | Payer: No Typology Code available for payment source | Primary: Student in an Organized Health Care Education/Training Program

## 2021-08-19 NOTE — Progress Notes
CC - Desires contraception    Subjective:     History of present illness - Cindy Snyder is a 46 y.o. [phone] here today to discuss contraception, well woman exam.  Patient feels well today. She does not have any concerns at this time. She has been using Depo Provera for about 14 years for pregnancy prevention and to control her menstrual periods. This method has worked very well for her and she would like to continue using it.     Gynecology history:  Menses: irregular spotting every 3-6 months  Sexually active: is currently  History of abnormal paps: 2020 NILM, HR HPV+  Colposcopy: 2021, LSIL/CIN1  Last MMG: was normal, 01/2021  History of abnormal mammograms: Denies  STD history: no past history    Contraception:  Current Contraception:  Depo-Provera  Past Contraception:  OCP's . and Depo-Provera  Would you like to become pregnant within the next year? No, she is done with childbearing  Pertinent Past Medical History:  None    Review of Systems   Constitutional: Negative for fatigue, fever and unexpected weight change.   HENT: Negative for voice change.    Respiratory: Negative for cough and shortness of breath.    Cardiovascular: Negative for chest pain and leg swelling.   Gastrointestinal: Negative for abdominal pain, blood in stool, constipation, diarrhea, nausea and vomiting.   Endocrine: Negative for heat intolerance.   Genitourinary: Negative for difficulty urinating, dyspareunia, dysuria, enuresis, frequency, genital sores, hematuria, menstrual problem, pelvic pain, urgency, vaginal bleeding, vaginal discharge and vaginal pain.   Musculoskeletal: Positive for arthralgias. Negative for back pain.   Skin: Negative for rash.   Neurological: Negative for light-headedness and headaches.   Hematological: Negative for adenopathy. Does not bruise/bleed easily.   Psychiatric/Behavioral: Negative for confusion. The patient is not nervous/anxious.    ?    Medical History:   Diagnosis Date   ? Fibroids    ? Ovarian cyst    ? Unspecified problems with internal organs        Surgical History:   Procedure Laterality Date   ? LAPAROSCOPIC CHOLECYSTECTOMY N/A 04/26/2020    Performed by Flo Shanks, Alecia Lemming., MD at [facility] OR       Family History   Problem Relation Age of Onset   ? High Cholesterol Mother    ? Hearing Loss Mother    ? Learning Disability Mother    ? Asthma Brother    ? Asthma Maternal Grandmother    ? Cancer-Lung Maternal Grandmother    ? Cancer-Prostate Maternal Grandfather    ? Arthritis Neg Hx    ? Bleeding Disorders Neg Hx    ? Cancer-Colon Neg Hx    ? Cancer-Breast Neg Hx    ? Cancer-Ovarian Neg Hx    ? Cancer-Uterine Neg Hx    ? Cleft Lip Neg Hx    ? Cervical Cancer Neg Hx    ? Cleft Palate Neg Hx    ? Diabetes Neg Hx    ? Heart Disease Neg Hx    ? Migraines Neg Hx    ? Miscarriages Neg Hx    ? Rashes/Skin Problems Neg Hx    ? Seizures Neg Hx    ? Thyroid Disease Neg Hx    ? VTE Neg Hx    ? Unknown to Patient Neg Hx    ? Blindness Neg Hx    ? Cataract Neg Hx    ? Glaucoma Neg Hx    ?  Macular Degen Neg Hx    ? Retinal Detachment Neg Hx        Social History     Tobacco Use   ? Smoking status: Never   ? Smokeless tobacco: Never   Vaping Use   ? Vaping Use: Never used   Substance Use Topics   ? Alcohol use: No   ? Drug use: No       Allergies  Patient has no known allergies.     Medications  Current Outpatient Medications   Medication Sig Dispense Refill   ? diclofenac (VOLTAREN) 1 % topical gel Apply four g topically to affected area four times daily. 300 g 3   ? fexofenadine (ALLEGRA) 60 mg tablet Take one tablet by mouth twice daily.     ? fluticasone propionate (FLONASE) 50 mcg/actuation nasal spray, suspension Apply one spray into nose as directed twice daily.     ? medroxyprogesterone acetate (DEPO-PROVERA IM) Inject 1 Dose into the muscle every 90 days.     ? simethicone (GAS RELIEF (SIMETHICONE)) 80 mg chew tablet Chew one tablet by mouth every 6 hours as needed for Flatulence. 30 tablet 0     No current facility-administered medications for this visit.         Objective:     Physical Exam    Vitals:    08/20/21 1421   BP: 119/77   Pulse: 56   Temp: 36.8 ?C (98.2 ?F)     General: She is oriented to person, place, and time. She appears well-developed and well-nourished.   Cardiovascular: Normal rate and intact distal pulses.    Pulmonary/Chest: Effort normal.   Abdominal: Soft. She exhibits no distension and no mass. There is no tenderness. There is no rebound and no guarding.   Genitourinary: exam chaperoned by nurse  - External genitalia - normal  - Urethral meatus - normal  - Vagina - no lesions, no discharge  - Cervix - no lesions  Neurological: She is alert and oriented to person, place, and time.   Skin: Skin is warm. No erythema.   Psychiatric: She has a normal mood and affect.     Assessment:     Cindy Snyder is a 46 y.o. [phone] here today for well woman exam.     Plan:     Cervical cancer screening  -Pap smear collected today, HPV cotesting  -H/o colposcopy with LSIL/CIN1    Breast cancer screening  -Mammogram UTD 01/2021    Colon cancer screening  -Colonoscopy recommended    STD screening  -Declines, no history    Contraception  -Depo-Provera, continue use q3 months  -Discussed risk of reversible bone density loss with extended use of Depo-Provera >2 years, discussed alternatives. Patient declines alternative contraceptive methods and would prefer to continue using Depo-Provera.  -Continue health diet with adequate calcium and vitamin D intake    RTC in 1 year or sooner if needed    D/w Dr. Adolphus Birchwood, MD   PGY1 - Obstetrics & Gynecology

## 2021-08-20 ENCOUNTER — Ambulatory Visit
Admit: 2021-08-20 | Discharge: 2021-08-21 | Payer: BC Managed Care – PPO | Primary: Student in an Organized Health Care Education/Training Program

## 2021-08-20 ENCOUNTER — Encounter
Admit: 2021-08-20 | Discharge: 2021-08-20 | Payer: No Typology Code available for payment source | Primary: Student in an Organized Health Care Education/Training Program

## 2021-08-20 VITALS — BP 119/77 | HR 56 | Temp 98.20000°F | Ht 66.0 in | Wt 185.6 lb

## 2021-08-20 DIAGNOSIS — Z01419 Encounter for gynecological examination (general) (routine) without abnormal findings: Secondary | ICD-10-CM

## 2021-08-20 DIAGNOSIS — N83209 Unspecified ovarian cyst, unspecified side: Secondary | ICD-10-CM

## 2021-08-20 DIAGNOSIS — R6889 Other general symptoms and signs: Secondary | ICD-10-CM

## 2021-08-20 DIAGNOSIS — Z3042 Encounter for surveillance of injectable contraceptive: Secondary | ICD-10-CM

## 2021-08-20 DIAGNOSIS — D219 Benign neoplasm of connective and other soft tissue, unspecified: Secondary | ICD-10-CM

## 2021-08-20 LAB — THIN PREP HOLD LABEL

## 2021-08-20 NOTE — Patient Instructions
General Instructions  For prompt and efficient communication, MyChart is preferred. Please sign up.  [email or web address]      To Contact Me:   Please send a MyChart message to your doctor or call [phone] to reach your doctor's nurse team. Please only call once in a 24-hour period. Leave a voicemail for the nurse to respond.  Calls or messages received after 4pm will be returned the next business day.  To Have Medication Refilled:   Please use the MyChart Refill request or contact your pharmacy directly to request medication refills. Please allow 72 hours.  To Receive Your Test Results:  Please allow 2 business day for labs to result in MyChart.  Please allow 4 business days for other tests, including cardiac studies, blood bank, and radiology results.  It can take up to 10 days for pathology  Our Lab (Location, Hours, and Fasting Information)  Lab Location: the 1st floor of the Medical Office Building, directly to the left of the Information Desk.  Lab Hours: Monday 6:30 am-6 pm, Tuesday-Friday 7 am-6 pm, and Saturday 7 am-noon.   Fasting for Lab: For fasting labs, please:   Do not eat for at least 8-10 hours before having your labs drawn, drink plenty of water, and make sure to continue your medications as prescribed (unless otherwise specified).  Radiology is on the 2nd floor of the Medical Office Building. Please call [phone], option #1; Mammogram at [facility] location, please call [phone], option #1.  To Schedule an Appointment:   Contact our schedulers at [phone] and select #1 to make an appointment. At this time, you will be encouraged to signed up to MyChart if you not active.     Communication preferences can be managed in MyChart to ensure you receive important appointment notifications    For Urgent Questions:   For medical emergencies, call 911.  On nights, weekends or holidays, call the Hospital operator at [phone], and ask for the ?Ob/Gyn Physician on call or Certified Nurse Midwife on call.?        We value your feedback and you may receive a survey about your experience with our office. If you had a favorable experience, the only positive survey results that we will receive are if we are rated in the 9 or 10 range out of 10 points. Please let us know why we did not earn 9-10 rating.  Thank you.

## 2021-08-21 DIAGNOSIS — Z124 Encounter for screening for malignant neoplasm of cervix: Principal | ICD-10-CM

## 2021-08-22 ENCOUNTER — Ambulatory Visit
Admit: 2021-08-22 | Discharge: 2021-08-23 | Payer: BC Managed Care – PPO | Primary: Student in an Organized Health Care Education/Training Program

## 2021-08-22 ENCOUNTER — Encounter
Admit: 2021-08-22 | Discharge: 2021-08-22 | Payer: No Typology Code available for payment source | Primary: Student in an Organized Health Care Education/Training Program

## 2021-08-22 DIAGNOSIS — Z3042 Encounter for surveillance of injectable contraceptive: Principal | ICD-10-CM

## 2021-08-22 MED ORDER — MEDROXYPROGESTERONE 150 MG/ML IM SYRG
150 mg | Freq: Once | INTRAMUSCULAR | 0 refills | Status: CP
Start: 2021-08-22 — End: ?
  Administered 2021-08-22: 14:00:00 150 mg via INTRAMUSCULAR

## 2021-10-30 ENCOUNTER — Encounter: Admit: 2021-10-30 | Discharge: 2021-10-30 | Payer: No Typology Code available for payment source

## 2021-11-12 ENCOUNTER — Emergency Department
Admit: 2021-11-12 | Discharge: 2021-11-12 | Disposition: A | Discharge status: Home / Self Care | Payer: BC Managed Care – PPO | Attending: Family

## 2021-11-12 ENCOUNTER — Encounter: Admit: 2021-11-12 | Discharge: 2021-11-12 | Payer: No Typology Code available for payment source

## 2021-11-12 ENCOUNTER — Ambulatory Visit: Admit: 2021-11-12 | Discharge: 2021-11-12 | Payer: BC Managed Care – PPO

## 2021-11-12 DIAGNOSIS — W19XXXA Unspecified fall, initial encounter: Secondary | ICD-10-CM

## 2021-11-12 DIAGNOSIS — R6889 Other general symptoms and signs: Secondary | ICD-10-CM

## 2021-11-12 DIAGNOSIS — N83209 Unspecified ovarian cyst, unspecified side: Secondary | ICD-10-CM

## 2021-11-12 DIAGNOSIS — S060X0A Concussion without loss of consciousness, initial encounter: Secondary | ICD-10-CM

## 2021-11-12 DIAGNOSIS — S80212A Abrasion, left knee, initial encounter: Secondary | ICD-10-CM

## 2021-11-12 DIAGNOSIS — S40022A Contusion of left upper arm, initial encounter: Secondary | ICD-10-CM

## 2021-11-12 DIAGNOSIS — S0990XA Unspecified injury of head, initial encounter: Secondary | ICD-10-CM

## 2021-11-12 DIAGNOSIS — D219 Benign neoplasm of connective and other soft tissue, unspecified: Secondary | ICD-10-CM

## 2021-11-12 DIAGNOSIS — W06XXXA Fall from bed, initial encounter: Secondary | ICD-10-CM

## 2021-11-12 MED ORDER — ACETAMINOPHEN 500 MG PO TAB
1000 mg | Freq: Once | ORAL | 0 refills | Status: CP
Start: 2021-11-12 — End: ?
  Administered 2021-11-12: 18:00:00 1000 mg via ORAL

## 2021-11-12 NOTE — ED Notes
46 yo F presents to ED w/ cc of fall. Pt reports that she rolled out of bed last night and then hit her head on the nightstand by her bed. Denies LOC or use of thinners. Pt is A&O x4, respirations even and NL, pulses palp bilaterally. Cart locked in lowest position, bed rails raised x2, call light within reach.    Medical History:   Diagnosis Date    Fibroids     Ovarian cyst     Unspecified problems with internal organs

## 2021-11-12 NOTE — Unmapped
You are seen in the emergency room after falling out of bed.  Your exam is reassuring and you likely have contusions, which are deep bruises in your arm.  You may have a mild concussion after hitting your head, and I recommend taking Tylenol and ibuprofen, resting, staying hydrated, avoiding alcohol, and do not drive if you feel dizzy.  Return to emergency room or call your primary care provider if you have severe pain, dizziness that does not get better, uncontrollable vomiting, or you lose consciousness.

## 2021-11-12 NOTE — Progress Notes
Date of Service: 11/12/2021    Cindy Snyder is a 46 y.o. female.  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  Her family found her she seemed confused and she still has some mild feeling of just not herself out of sorts slight confusion and with a persistent left-sided headache.  She also abraded her knee.  Denies any neck pain.  She does not remember loss of bowel or bladder.  Generally she has been fairly healthy.  Recent visits with gynecology reviewed as well as problem list       Review of Systems      Objective:         ? diclofenac (VOLTAREN) 1 % topical gel Apply four g topically to affected area four times daily.   ? fexofenadine (ALLEGRA) 60 mg tablet Take one tablet by mouth twice daily.   ? fluticasone propionate (FLONASE) 50 mcg/actuation nasal spray, suspension Apply one spray into nose as directed twice daily.   ? medroxyprogesterone acetate (DEPO-PROVERA IM) Inject 1 Dose into the muscle every 90 days.   ? simethicone (GAS RELIEF (SIMETHICONE)) 80 mg chew tablet Chew one tablet by mouth every 6 hours as needed for Flatulence.     There were no vitals filed for this visit.  There is no height or weight on file to calculate BMI.     Physical Exam  Pleasant.  Oriented to time and place but headache and just feels out of sorts, slightly woozy and this is new going on since 1:00 when she fell out of her bed.  She also abraded the knee.  She denies neck pain.  Does not remember specifically loss of consciousness but just this confusional issue since the injury.  No history of concussions.  Denies any focal neurological complaints as discussed.  Tenderness of the left forehead and to the parietal area without step-off or swelling or obvious deformity.  PERRLA.  EOMI.  No focal neurological findings on routine exam     Assessment and Plan:  Haset [place]. Acklin was seen today for fall.    Diagnoses and all orders for this visit:    Fall from bed, initial encounter    Closed head injury, initial encounter    Concussion without loss of consciousness, initial encounter  Comments:  Headache mild confusion    Discussed with mom concussion symptoms with a persistent headache.    Continue to care discussed in is referred to the emergency room for possible imaging with her persistent left-sided headache closed head injury and apparent concussion symptoms

## 2021-11-12 NOTE — ED Notes
Pt given discharge instructions. Verbalized understanding. Pt A&O x4, Spontaneous and NL breathing. No PIV. Ambulates with a steady gait. All belongings left with patient.

## 2021-11-14 ENCOUNTER — Encounter: Admit: 2021-11-14 | Discharge: 2021-11-14 | Payer: No Typology Code available for payment source

## 2021-11-14 NOTE — Telephone Encounter
Noted.Will Follow As Needed.

## 2021-11-14 NOTE — Telephone Encounter
ED Discharge Follow Up  Reached patient: Yes, patient was identified, for their safety, using dual identification of name and date of birth  Patient Date of Birth: Dec 25, 1975  Admission Information  Hospital Name : [facility] (includes main campus, [place], [place], [place], [place])  ED Admission Date: 11/12/21   ED Discharge Date: 11/12/21   Admission Diagnosis: Fall   Discharge Diagnosis: Fall, initial encounter  Contusion of left upper arm, initial encounter  Abrasion of left knee, initial encounter  Hospital Services: Unplanned  Today's call is 2 (calendar) days post discharge    Discharge Instruction Review  Did patient receive and understand discharge instructions? Yes  Are there concerns regarding the patient?s ADL?s ? No    Medication Reconciliation  Changes to pre-ED visit medications? No  Were new prescriptions filled? N/A  Meds reviewed and reconciled? Yes     NOT TAKING any prescribed medications    Current Outpatient Medications   Medication Instructions   ? diclofenac sodium (VOLTAREN) 4 g, Topical, FOUR TIMES DAILY   ? fexofenadine (ALLEGRA) 60 mg, Oral, TWICE DAILY   ? fluticasone propionate (FLONASE) 50 mcg/actuation nasal spray, suspension 1 spray, Nasal, TWICE DAILY   ? medroxyprogesterone acetate (DEPO-PROVERA IM) 1 Dose, Intramuscular, EVERY 90 DAYS   ? simethicone (GAS RELIEF (SIMETHICONE)) 80 mg, Oral, EVERY  6 HOURS PRN           Understanding Condition  Having any current symptoms? Yes, Pain/discomfort; she still has some soreness, but denies any vision changes, feeling dizzy/lightheaded. She was advised by ED to take ibuprofen/Tylenol for pain if needed. She denies any needs from PCP office at this time, and will f/u if she does not continue to improve.  Do you have a history of Heart Failure? No  Patient understands when to seek additional medical care? Yes  Is patient aware of [facility] locations? Yes  Urgent Care appropriate for this diagnosis? No  Other items discussed:     Scheduling Follow-up Appointment  Upcoming appointments: No future appointments.  When was patient?s last PCP visit: Visit date not found  PCP primary location: [facility]  PCP appointment scheduled? No, patient declined appointment  Specialist appointment scheduled? No  Is assistance with transportation needed? No  MyChart message sent? Active in MyChart. No message sent.   Artera text sent? No      ED Communication   Did Pt call clinic prior to going to ED? Yes Patient sent to ED from Urgent Care  Reason patient went to ED: Referred by clinic/provider    Lavell Islam

## 2021-11-26 ENCOUNTER — Encounter: Admit: 2021-11-26 | Discharge: 2021-11-26 | Payer: No Typology Code available for payment source

## 2022-01-05 ENCOUNTER — Ambulatory Visit: Admit: 2022-01-05 | Discharge: 2022-01-05 | Payer: BC Managed Care – PPO

## 2022-01-05 ENCOUNTER — Encounter: Admit: 2022-01-05 | Discharge: 2022-01-05 | Payer: No Typology Code available for payment source

## 2022-01-05 ENCOUNTER — Ambulatory Visit: Admit: 2022-01-05 | Discharge: 2022-01-06 | Payer: BC Managed Care – PPO

## 2022-01-05 DIAGNOSIS — R6889 Other general symptoms and signs: Secondary | ICD-10-CM

## 2022-01-05 DIAGNOSIS — D219 Benign neoplasm of connective and other soft tissue, unspecified: Secondary | ICD-10-CM

## 2022-01-05 DIAGNOSIS — M6289 Other specified disorders of muscle: Secondary | ICD-10-CM

## 2022-01-05 DIAGNOSIS — N83209 Unspecified ovarian cyst, unspecified side: Secondary | ICD-10-CM

## 2022-01-05 DIAGNOSIS — M545 Midline low back pain without sciatica, unspecified chronicity: Secondary | ICD-10-CM

## 2022-01-05 DIAGNOSIS — R29898 Other symptoms and signs involving the musculoskeletal system: Secondary | ICD-10-CM

## 2022-01-05 MED ORDER — DICLOFENAC SODIUM 1 % TP GEL
4 g | Freq: Four times a day (QID) | TOPICAL | 3 refills | 25.00000 days | Status: AC
Start: 2022-01-05 — End: ?
  Filled 2022-02-09: qty 300, 19d supply, fill #1

## 2022-01-05 MED ORDER — CYCLOBENZAPRINE 5 MG PO TAB
5 mg | ORAL_TABLET | Freq: Three times a day (TID) | ORAL | 0 refills | 30.00000 days | Status: AC
Start: 2022-01-05 — End: ?
  Filled 2022-01-05: qty 30, 10d supply, fill #1

## 2022-01-05 NOTE — Progress Notes
Progress Note - Outpatient    Encounter Date: 01/05/2022   Patient Name: Cindy Snyder   Date of Birth: 01-22-1976   MRN: [id number]   Encounter Provider Alanda Slim, DO   Primary Care Physician:  Wyn Forster, MD (General)       Subjective     Chief Complaint:   Chief Complaint   Patient presents with   ? Back Pain   ? Falls       History of Present Illness  Cindy Snyder is a 46 y.o. female who presents for urgent care follow up and subsequent recurrent falls     Cindy Snyder is a 46 y.o. female, with a history of ovarian cyst and fibroids who presents to the emergency department for fall. Patient states she was sleeping this morning and fell out of bed striking her head on the side table. She was able to walk to the bathroom however when she walked back from the bathroom she noted dizziness and mild confusion. She does report left shoulder pain and left knee pain. She states head strike was on the right side of the head however the pain is noted on the left side of her head. Dizziness is triggered with head movement.    ED Course  Patient is a 46 year old female coming in for head pain, shoulder pain, knee pain after rolling out of bed and hitting her head on the side table.  States occurred last night and had mild dizziness and mild confusion after injury.     Able to rule out necessity for CT scan based on [place] head CT rule.  Exam is negative for deformity, swelling, redness, bruising, lacerations.  No acute injuries noted in exam.  Discussed that she may have a contusion and recommend ibuprofen, ice, stretching, rest to help with the pain.  Reassured patient that she does not need a head CT scan and discussed return precautions.  Will provide Tylenol.  Patient is agreeable to follow-up with primary care provider as needed and is stable for discharge at this time.    Since being seen in urgent care she reports continued back pain and one subsequent fall.   [place] experienced a mechanical fall when slipping on water and landed on her knees. Did not hit her head. Has no continued knee pain.     Back pain: bilateral low back pain. This has been ongoing since her initial fall and trip to UC. She reports pain is same and not improved. She has seen a chiropractor since then 6x. She takes tylenol and ibuprofen alternating to pain. She is on a bowling league and wears a back brace that she obtained on her own.   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   She has been doing some home stretches but not with any formal program.   Has not tried heat.       Depression anxiety  One of her children passed away in 2022/08/26.  Since that time she has been noting some worsening anxiety, has a PHQ-9 of 0 but GAD-7 of 10 noted today.         Review of Systems -  W/o fever, chills, unintentional weight loss, sore throat, intractable HA, acute vision changes, chest pain, dyspnea, N/V/D/C, melena or hematochezia  Denies abnormal discharge, odor, vaginal pruritis, intermenstrual spotting, lesions or urinary U/F/D.    She does have stress incontinence.  Generalized anxiety  Past Medical History:   Medical History:   Diagnosis Date   ? Fibroids    ? Ovarian cyst    ? Unspecified problems with internal organs        Surgical History:  Surgical History:   Procedure Laterality Date   ? LAPAROSCOPIC CHOLECYSTECTOMY N/A 04/26/2020    Performed by Flo Shanks, Alecia Lemming., MD at [facility] OR       Social History:   Social History     Socioeconomic History   ? Marital status: Separated   ? Number of children: 10   Occupational History     Employer: [employer]   Tobacco Use   ? Smoking status: Never   ? Smokeless tobacco: Never   Vaping Use   ? Vaping Use: Never used   Substance and Sexual Activity   ? Alcohol use: No   ? Drug use: No   ? Sexual activity: Yes     Partners: Male     Birth control/protection: Condom, Injection   Social History Narrative Lives with 4 daughters. No pets.        Family History:   Family History   Problem Relation Age of Onset   ? High Cholesterol Mother    ? Hearing Loss Mother    ? Learning Disability Mother    ? Asthma Brother    ? Asthma Maternal Grandmother    ? Cancer-Lung Maternal Grandmother    ? Cancer-Prostate Maternal Grandfather    ? Arthritis Neg Hx    ? Bleeding Disorders Neg Hx    ? Cancer-Colon Neg Hx    ? Cancer-Breast Neg Hx    ? Cancer-Ovarian Neg Hx    ? Cancer-Uterine Neg Hx    ? Cleft Lip Neg Hx    ? Cervical Cancer Neg Hx    ? Cleft Palate Neg Hx    ? Diabetes Neg Hx    ? Heart Disease Neg Hx    ? Migraines Neg Hx    ? Miscarriages Neg Hx    ? Rashes/Skin Problems Neg Hx    ? Seizures Neg Hx    ? Thyroid Disease Neg Hx    ? VTE Neg Hx    ? Unknown to Patient Neg Hx    ? Blindness Neg Hx    ? Cataract Neg Hx    ? Glaucoma Neg Hx    ? Macular Degen Neg Hx    ? Retinal Detachment Neg Hx          Objective     Medications  ? diclofenac (VOLTAREN) 1 % topical gel Apply four g topically to affected area four times daily.   ? fexofenadine (ALLEGRA) 60 mg tablet Take one tablet by mouth twice daily.   ? fluticasone propionate (FLONASE) 50 mcg/actuation nasal spray, suspension Apply one spray into nose as directed twice daily.   ? medroxyprogesterone acetate (DEPO-PROVERA IM) Inject 1 Dose into the muscle every 90 days.   ? simethicone (GAS RELIEF (SIMETHICONE)) 80 mg chew tablet Chew one tablet by mouth every 6 hours as needed for Flatulence.       Allergies: No Known Allergies    Vitals  Vitals:    01/05/22 0830   BP: 107/71   BP Source: Arm, Left Upper   Pulse: 63   Temp: 36.6 ?C (97.9 ?F)   Resp: 16   SpO2: 100%   TempSrc: Skin   PainSc: Four   Weight: 84.2 kg (  185 lb 9.6 oz)   Height: 165.1 cm (5' 5)       Body mass index is 30.89 kg/m?Marland Kitchen        Physical Exam:  General: Alert & answer questions appropriately  Head: Atraumatic, without obvious abnormality  Eyes: Sclera non-icteric, EOMI  Ears: External ears grossly normal, hearing is appropriate.  Nose: Nares normal, Moist Mucosal membranes.  Neck: Supple and symmetric.   Lungs: non-labored respiration  Back: SP tenderness noted L5/S1, paraspinal muscle tenderness present lumbar and thoracic region bilaterally.  Left SI joint tenderness  Pelvis: Right anterior rotated hip  Extremities: Moves all extremities, no cyanosis or edema.  Left leg longer than right leg on initial exam, improvement noted after muscle energy  Skin: Warm and dry, no obvious rashes or lesions  Neurologic: Grossly intact.         Assessment/Plan     [name]. Schwallie was seen today for back pain and falls.    Diagnoses and all orders for this visit:    Midline low back pain without sciatica, unspecified chronicity  Continued bilateral low back pain since fall, multiple chiropractic treatments since fall.  Noted spinal process tenderness on exam.  Will obtain imaging today to rule out bony abnormality.  Refer to physical therapy.  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  Rec discussed sedating side effect and need to avoid operating heavy machinery and driving while taking  Rotation of pelvis  -Muscle energy performed to physical exam finding of left anterior rotated hip with reassessment noting improvement  -     AMB REFERRAL TO PHYSICAL THERAPY  -     L SPINE AP & LAT; Future; Expected date: 01/05/2022  -     AMB REFERRAL TO PELVIC FLOOR REHAB  -     diclofenac sodium (VOLTAREN) 1 % topical gel; Apply four grams topically to affected area four times daily.  -     Flexeril 5 mg 3 times daily as needed    Pelvic floor dysfunction in female  Stress incontinenc  Patient has had 7 vaginal deliveries, notes stress incontinence, low back pain discussed benefits of pelvic floor physical therapy.  Verbalized today  -     AMB REFERRAL TO PELVIC FLOOR REHAB  -     AMB REFERRAL TO PELVIC FLOOR REHAB    Colon cancer screening  Routine screening  -     COLOGUARD (DNA)-CLINIC ONLY    Anxiety  Grief  Likely reactive to grief.  She lost a child in April.  Offered IBH services and support through family medicine with [facility] to reach out if she is ready to talk further or needs any assistance.    Other orders  Recommended follow-up for routine physical exam 3 months with PCP.  At that time would benefit patient to further discuss her anxiety in addition to checking in on back pain.  -     [place] AMB FOLLOW UP IN FAMILY MEDICINE; Future; Expected date: 04/06/2022      Discussed with attending physician Dr. Charm Barges.    Graylin Shiver, DO, MA (she/her)  PGY-3 Resident Physician  Family Medicine Residency  [facility]    Answers for HPI/ROS submitted by the patient on 01/05/2022  Chronicity: new  Onset: more than  1 month ago  Frequency: daily  Progression since onset: gradually worsening  Pain location: lumbar spine  Pain quality: aching  Radiates to: right knee  Pain - numeric: 5/10  Pain is: worse during the day  Aggravated by: bending, position, standing, twisting  Stiffness is present: all day  abdominal pain: No  bladder incontinence: No  bowel incontinence: No  chest pain: No  dysuria: No  fever: No  headaches: No  leg pain: Yes  numbness: No  paresis: No  paresthesias: No  pelvic pain: No  perianal numbness: No  tingling: No  weakness: Yes  weight loss: No  Risk factors: recent trauma

## 2022-01-05 NOTE — Progress Notes
I have reviewed the case with Dr. Sabra Heck at the time of the visit and agree with the evaluation and plan as documented by the resident.   Lianne Cure, MD

## 2022-01-06 ENCOUNTER — Encounter: Admit: 2022-01-06 | Discharge: 2022-01-06 | Payer: No Typology Code available for payment source

## 2022-01-06 DIAGNOSIS — N393 Stress incontinence (female) (male): Secondary | ICD-10-CM

## 2022-01-06 DIAGNOSIS — Z1211 Encounter for screening for malignant neoplasm of colon: Secondary | ICD-10-CM

## 2022-01-07 ENCOUNTER — Encounter: Admit: 2022-01-07 | Discharge: 2022-01-07 | Payer: No Typology Code available for payment source

## 2022-01-12 ENCOUNTER — Encounter: Admit: 2022-01-12 | Discharge: 2022-01-12 | Payer: No Typology Code available for payment source

## 2022-01-12 NOTE — Telephone Encounter
Pt called and left vxm stating she needed to schedule a nurse visit.    Rtd pt call, pt states she needs to schedule a nurse visit for depo injection. Assisted pt in scheduling nurse visit for depo injection. Advised pt her last depo was on 08/22/21 and therefore is out of the recommended timeframe, will need to leave urine sample for pregnancy test at appt. Pt v/u and accepted appt time, denies further questions at this time.

## 2022-01-13 ENCOUNTER — Encounter: Admit: 2022-01-13 | Discharge: 2022-01-13 | Payer: No Typology Code available for payment source

## 2022-01-13 MED ORDER — MEDROXYPROGESTERONE 150 MG/ML IM SYRG
150 mg | Freq: Once | INTRAMUSCULAR | 0 refills | Status: CP
Start: 2022-01-13 — End: ?
  Administered 2022-01-13: 16:00:00 150 mg via INTRAMUSCULAR

## 2022-01-13 NOTE — Progress Notes
Patient to clinic for depo provera. UPT negative. Injection given in left gluteal muscle. No concerns at this time. All questions answered.

## 2022-01-14 ENCOUNTER — Ambulatory Visit: Admit: 2022-01-13 | Discharge: 2022-01-14 | Payer: BC Managed Care – PPO

## 2022-01-14 DIAGNOSIS — Z01812 Encounter for preprocedural laboratory examination: Secondary | ICD-10-CM

## 2022-01-14 DIAGNOSIS — Z3042 Encounter for surveillance of injectable contraceptive: Secondary | ICD-10-CM

## 2022-01-28 ENCOUNTER — Encounter: Admit: 2022-01-28 | Discharge: 2022-01-28 | Payer: No Typology Code available for payment source

## 2022-01-29 ENCOUNTER — Encounter: Admit: 2022-01-29 | Discharge: 2022-01-29 | Payer: No Typology Code available for payment source

## 2022-02-05 ENCOUNTER — Encounter: Admit: 2022-02-05 | Discharge: 2022-02-05 | Payer: No Typology Code available for payment source

## 2022-02-09 ENCOUNTER — Encounter: Admit: 2022-02-09 | Discharge: 2022-02-09 | Payer: No Typology Code available for payment source

## 2022-02-17 ENCOUNTER — Encounter: Admit: 2022-02-17 | Discharge: 2022-02-17 | Payer: No Typology Code available for payment source

## 2022-02-17 ENCOUNTER — Ambulatory Visit: Admit: 2022-02-17 | Discharge: 2022-02-18 | Payer: BC Managed Care – PPO

## 2022-02-17 DIAGNOSIS — R6889 Other general symptoms and signs: Secondary | ICD-10-CM

## 2022-02-17 DIAGNOSIS — N83209 Unspecified ovarian cyst, unspecified side: Secondary | ICD-10-CM

## 2022-02-17 DIAGNOSIS — D219 Benign neoplasm of connective and other soft tissue, unspecified: Secondary | ICD-10-CM

## 2022-02-17 DIAGNOSIS — R490 Dysphonia: Secondary | ICD-10-CM

## 2022-02-17 MED FILL — DICLOFENAC SODIUM 1 % TP GEL: 1 % | TOPICAL | 7 days supply | Qty: 100 | Fill #1 | Status: CP

## 2022-02-17 NOTE — Progress Notes
Date of Service: 02/17/2022    Subjective:             Cindy Snyder is a 46 y.o. female.    Patient Reported Other  What topic(s) would you like to cover during your appointment?:  Over 3 weeks of being hoarse  Please describe the issue(s) and history with the issue (location, severity, duration, symptoms, etc.).:  I get out of breath when trying to talk  What has been done so far to take care of the issue(s)?:  Over the counter cold and sinus medicine  What are your goals for this visit?:  Find the source      Hoarseness  Been going on for a few weeks, no acute sickness that she remembers starting it  Usually seasons change and voice changes for a few days and then goes away, but not going away now  Has been taking pseudofed and flonase, not taking daily antihistamine, did not know she was supposed to  Headaches and some shortness of breath with long talking or taking stairs, some congestion, no sore throat  Has been unable to sing which has been frustrating  Has stayed the same, not getting better or worse  No acid reflux symptoms       Review of Systems   HENT: Positive for voice change.          Objective:         ? cyclobenzaprine (FLEXERIL) 5 mg tablet Take one tablet by mouth three times daily. Indications: muscle spasm   ? diclofenac sodium (VOLTAREN) 1 % topical gel Apply four grams topically to affected area four times daily.   ? fexofenadine (ALLEGRA) 60 mg tablet Take one tablet by mouth twice daily.   ? fluticasone propionate (FLONASE) 50 mcg/actuation nasal spray, suspension Apply one spray into nose as directed twice daily.   ? simethicone (GAS RELIEF (SIMETHICONE)) 80 mg chew tablet Chew one tablet by mouth every 6 hours as needed for Flatulence.     There were no vitals filed for this visit.  There is no height or weight on file to calculate BMI.     Physical Exam  Constitutional:       General: She is not in acute distress.     Appearance: Normal appearance.   HENT:      Head: Normocephalic and atraumatic.   Eyes:      Extraocular Movements: Extraocular movements intact.      Conjunctiva/sclera: Conjunctivae normal.   Pulmonary:      Effort: No tachypnea, accessory muscle usage, prolonged expiration or respiratory distress.      Comments: Significant hoarseness of voice throughout interview  Neurological:      Mental Status: She is alert and oriented to person, place, and time.   Psychiatric:         Mood and Affect: Mood normal.         Behavior: Behavior normal.         Current PHQ Patient Scores:  PHQ-2: PHQ-2 Score: 0 (01/05/2022  9:12 AM)    PHQ-9: PHQ-9 Score: 0 (01/05/2022  9:12 AM)           Assessment and Plan:    Cindy Snyder was seen today for patient reported other.    Diagnoses and all orders for this visit:    Hoarseness of voice    New to me today  Advised continuing Flonase and Sudafed as needed, start a daily antihistamine as well  Unclear if this is a flareup of seasonal allergies versus acute laryngitis, unable to do ENT exam today given telehealth appointment  No indication for antibiotics or steroids  Advised patient to start daily antihistamine, get plenty of rest and hydration, and if hoarseness is still persistent into next week will refer to ENT for vocal cord evaluation

## 2022-04-15 ENCOUNTER — Encounter: Admit: 2022-04-15 | Discharge: 2022-04-15 | Payer: No Typology Code available for payment source

## 2022-04-15 ENCOUNTER — Ambulatory Visit: Admit: 2022-04-15 | Discharge: 2022-04-15 | Payer: BC Managed Care – PPO

## 2022-04-15 ENCOUNTER — Ambulatory Visit: Admit: 2022-04-15 | Discharge: 2022-04-15 | Payer: No Typology Code available for payment source

## 2022-04-15 DIAGNOSIS — N83209 Unspecified ovarian cyst, unspecified side: Secondary | ICD-10-CM

## 2022-04-15 DIAGNOSIS — M545 Midline low back pain without sciatica, unspecified chronicity: Secondary | ICD-10-CM

## 2022-04-15 DIAGNOSIS — Z0000 Encounter for general adult medical examination without abnormal findings: Secondary | ICD-10-CM

## 2022-04-15 DIAGNOSIS — R6889 Other general symptoms and signs: Secondary | ICD-10-CM

## 2022-04-15 DIAGNOSIS — F419 Anxiety disorder, unspecified: Secondary | ICD-10-CM

## 2022-04-15 DIAGNOSIS — Z1231 Encounter for screening mammogram for malignant neoplasm of breast: Secondary | ICD-10-CM

## 2022-04-15 DIAGNOSIS — Z3042 Encounter for surveillance of injectable contraceptive: Secondary | ICD-10-CM

## 2022-04-15 DIAGNOSIS — D219 Benign neoplasm of connective and other soft tissue, unspecified: Secondary | ICD-10-CM

## 2022-04-15 LAB — COMPREHENSIVE METABOLIC PANEL
ALBUMIN: 3.9 g/dL (ref 3.5–5.0)
ALK PHOSPHATASE: 61 U/L (ref 25–110)
ALT: 9 U/L (ref 7–56)
ANION GAP: 9 (ref 3–12)
AST: 15 U/L (ref 7–40)
BLD UREA NITROGEN: 8 mg/dL (ref 7–25)
CALCIUM: 9.3 mg/dL (ref 8.5–10.6)
CO2: 26 MMOL/L (ref 21–30)
CREATININE: 0.7 mg/dL (ref 0.4–1.00)
EGFR: >60 mL/min (ref >60)
GLUCOSE,PANEL: 102 mg/dL — ABNORMAL HIGH (ref 70–100)
POTASSIUM: 4.2 MMOL/L (ref 3.5–5.1)
SODIUM: 138 MMOL/L (ref 137–147)
TOTAL BILIRUBIN: 0.3 mg/dL (ref 0.3–1.2)
TOTAL PROTEIN: 7.7 g/dL (ref 6.0–8.0)

## 2022-04-15 LAB — LIPID PROFILE
CHOLESTEROL: 263 mg/dL — ABNORMAL HIGH (ref <200)
LDL: 196 mg/dL — ABNORMAL HIGH (ref <100)
TRIGLYCERIDES: 96 mg/dL (ref <150)

## 2022-04-15 LAB — HEPATITIS C ANTIBODY W REFLEX HCV PCR QUANT

## 2022-04-15 LAB — HEMOGLOBIN A1C: HEMOGLOBIN A1C: 5.6 % (ref >40)

## 2022-04-15 MED ORDER — CYCLOBENZAPRINE 5 MG PO TAB
5 mg | ORAL_TABLET | Freq: Three times a day (TID) | ORAL | 0 refills | 30.00000 days | Status: AC
Start: 2022-04-15 — End: ?
  Filled 2022-04-16: qty 30, 10d supply, fill #1

## 2022-04-15 MED ORDER — MEDROXYPROGESTERONE 150 MG/ML IM SYRG
150 mg | Freq: Once | INTRAMUSCULAR | 0 refills | Status: CP
Start: 2022-04-15 — End: ?
  Administered 2022-04-15: 16:00:00 150 mg via INTRAMUSCULAR

## 2022-04-15 MED ORDER — CYCLOBENZAPRINE 5 MG PO TAB
5 mg | ORAL_TABLET | Freq: Three times a day (TID) | ORAL | 0 refills | Status: CN
Start: 2022-04-15 — End: ?

## 2022-04-15 NOTE — Progress Notes
TDAP injection given in RD IM with out incident, VIS, and consent given.

## 2022-04-15 NOTE — Progress Notes
I performed a history and physical examination of the patient and discussed the management with the resident.  I reviewed the resident's note and agree with the documented findings and plan of care.  Patsy Zaragoza M Jeramy Dimmick, MD

## 2022-04-15 NOTE — Progress Notes
Patient presents to clinic for depo injection. UPT (-) in clinic. Denies any concerns or questions about medication. Next depo injection scheduled.     Date last Pap smear: .08/20/21  Last Depo-Provera: 01/13/22  Side Effects if any: None.  Serum HCG indicated? Yes- negative.  Depo-Provera 150 mg IM given by: Roxine Caddy, RN.  Next appointment due 3/14-3/28.    Future Appointments   Date Time Provider [place]   04/15/2022 10:00 AM Morene Antu, MD [facility]   06/04/2022  9:00 AM Claretha [place], OD [facility]   07/02/2022  1:30 PM [facility]

## 2022-04-15 NOTE — Progress Notes
Note to patient: The 21st Century Cures Act makes medical notes like these available to patients in the interest of transparency. However, be advised this is a medical document. It is intended as peer to peer communication. It is written in medical language and may contain abbreviations or verbiage that are unfamiliar. It may appear blunt or direct. Medical documents are intended to carry relevant information, facts as evident, and the clinical opinion of the practitioner    Progress Note - Outpatient    Encounter Date: 04/15/2022   Patient Name: Cindy Snyder   Date of Birth: 1976-04-16   MRN: [id number]   Encounter Provider Wyn Forster, MD   Primary Care Physician:  Wyn Forster, MD (General)       Subjective     Chief Complaint:   Chief Complaint   Patient presents with    Physical       History of Present Illness  Cindy Snyder is a 46 y.o. female who presents for annual exam.    CHIEF COMPLAINT  Annual Exam    PMHx: Fibroids  Last eye exam/ provider: Yearly, next scheduled February 2024  Last dental exam / dentist: Every 6 months    Lifestyle:  Exercise: Walking, especially at work  Nutrition: 2 meals day  Supplements: No  Sleep: About 6 hours  Employment: Production designer, theatre/television/film at [employer] safe at home?    SCREENINGS:  Alcohol use: None  Smoking: Tobacco  Depression/Anxiety: None  BMI: 31.48    Reproductive:  [id number]  Patient's last menstrual period was 03/20/2022 (exact date).  Last Pap: May 2023  Menstrual Cycles: Every couple months with Depo  Sexually Active: None  High risk behavior: None  Birth Control: Depo   STD history: None  PrEP Candidate? None  PNV? No    Vaccinations:  Flu shot: UTD  Covid19 Vaccine: Not interested  Prevnar: age 69 or with high risk condition DM, tobacco abuse, CHF, cardiomyopathy, asthma, COPD, liver disease, EtOH abuse.  Hep B: age 23 with increased risk - liver disease, hep C, fatty liver, AST/ALT 2x normal, ESRD, dialysis, HIV, diabetes Tdap: Due  Shingles: age 69    Family History of Breast, Ovarian, CRC?  No    Preventive Care:  Diabetes Screen Due  Lipid Screen: Due  Mammogram: Due  Pap Smear: UTD  Derm referral: N/A  Colonoscopy: Cologuard Oct 203  Chest CT non-contrast: N/A  Dexa: N/A  Genetic Screen N/A  STD Screen N/A  Hep C Ab: Due  HIV: Negative    Anxiety  -Anxiety at last visit related to grief as she had lost child in April  -Patient reports feeling much better since then and is no longer experiencing any symptoms    Back Pain  -Patient is adherent to Flexeril which patient states is working well  -Patient reports applying voltaren gel liberally to her back but feels like it is not work  -Has not tried PT yet as she has had a busy schedule    Past Medical History:   Medical History:   Diagnosis Date    Fibroids     Ovarian cyst     Unspecified problems with internal organs        Surgical History:  Surgical History:   Procedure Laterality Date    LAPAROSCOPIC CHOLECYSTECTOMY N/A 04/26/2020    Performed by Flo Shanks, Alecia Lemming., MD at [facility] OR       Social History:   Social History  Socioeconomic History    Marital status: Separated    Number of children: 10   Occupational History     Employer: [employer]   Tobacco Use    Smoking status: Never    Smokeless tobacco: Never   Vaping Use    Vaping Use: Never used   Substance and Sexual Activity    Alcohol use: No    Drug use: No    Sexual activity: Yes     Partners: Male     Birth control/protection: Condom, Injection   Social History Narrative    Lives with 4 daughters. No pets.        Family History:   Family History   Problem Relation Age of Onset    High Cholesterol Mother     Hearing Loss Mother     Learning Disability Mother     Asthma Brother     Asthma Maternal Grandmother     Cancer-Lung Maternal Grandmother     Cancer-Prostate Maternal Grandfather     Arthritis Neg Hx     Bleeding Disorders Neg Hx     Cancer-Colon Neg Hx     Cancer-Breast Neg Hx Cancer-Ovarian Neg Hx     Cancer-Uterine Neg Hx     Cleft Lip Neg Hx     Cervical Cancer Neg Hx     Cleft Palate Neg Hx     Diabetes Neg Hx     Heart Disease Neg Hx     Migraines Neg Hx     Miscarriages Neg Hx     Rashes/Skin Problems Neg Hx     Seizures Neg Hx     Thyroid Disease Neg Hx     VTE Neg Hx     Unknown to Patient Neg Hx     Blindness Neg Hx     Cataract Neg Hx     Glaucoma Neg Hx     Macular Degen Neg Hx     Retinal Detachment Neg Hx          Objective     Medications   cyclobenzaprine (FLEXERIL) 5 mg tablet Take one tablet by mouth three times daily. Indications: muscle spasm    diclofenac sodium (VOLTAREN) 1 % topical gel Apply four grams topically to affected area four times daily.    fexofenadine (ALLEGRA) 60 mg tablet Take one tablet by mouth twice daily.    fluticasone propionate (FLONASE) 50 mcg/actuation nasal spray, suspension Apply one spray into nose as directed twice daily.    simethicone (GAS RELIEF (SIMETHICONE)) 80 mg chew tablet Chew one tablet by mouth every 6 hours as needed for Flatulence.       Allergies: No Known Allergies    Vitals  Vitals:    04/15/22 0957   BP: 108/71   BP Source: Arm, Left Upper   Pulse: 69   Temp: 37.6 ?C (99.6 ?F)   TempSrc: Temporal   PainSc: Three   Weight: 85.8 kg (189 lb 3.2 oz)   Height: 165.1 cm (5' 5)       Body mass index is 31.48 kg/m?Marland Kitchen      Physical Exam  Constitutional:       General: She is not in acute distress.     Appearance: She is not ill-appearing.   HENT:      Head: Normocephalic.      Mouth/Throat:      Mouth: Mucous membranes are moist.   Eyes:      Extraocular  Movements: Extraocular movements intact.      Conjunctiva/sclera: Conjunctivae normal.   Neck:      Thyroid: No thyroid mass, thyromegaly or thyroid tenderness.   Cardiovascular:      Rate and Rhythm: Normal rate and regular rhythm.      Pulses: Normal pulses.      Heart sounds: Normal heart sounds.   Pulmonary:      Effort: Pulmonary effort is normal. No respiratory distress. Breath sounds: Normal breath sounds.   Abdominal:      General: Abdomen is flat. Bowel sounds are normal. There is no distension.      Palpations: Abdomen is soft.      Tenderness: There is no abdominal tenderness.   Skin:     General: Skin is warm and dry.      Capillary Refill: Capillary refill takes less than 2 seconds.   Neurological:      General: No focal deficit present.      Mental Status: She is alert and oriented to person, place, and time. Mental status is at baseline.   Psychiatric:         Mood and Affect: Mood normal.         Behavior: Behavior normal.       Assessment/Plan     Cindy Snyder was seen today for physical.    Diagnoses and all orders for this visit:    Encounter for annual health examination  -Lifestyle: exercise 150 min/week, mediterranean style diet and weight management information provided  -Recommend daily stress management and mindfulness practice  -Vision: advised yearly  -Dental: advised biannually  -Influenza: Recommended annually. Patient is UTD  -Tdap: Due today and administered  -Colon cancer screening: UTD, Negative Cologuard October 2023. Next time due in 2026.  -Mammogram: Due, ordered  -Pap smear: UTD, last pap May 2023 normal  -Ordered HEPATITIS C ANTIBODY W REFLEX HCV PCR QUANT; Future; Expected date: 04/15/2022  -Ordered COMPREHENSIVE METABOLIC PANEL; Future; Expected date: 04/15/2022  -Ordered HEMOGLOBIN A1C; Future; Expected date: 04/15/2022  -Ordered LIPID PROFILE; Future; Expected date: 04/15/2022    Midline low back pain without sciatica, unspecified chronicity  -Well controlled currently  -Continue cyclobenzaprine (FLEXERIL) 5 mg tablet; Take one tablet by mouth three times daily. Indications: muscle spasm  -Continue Voltaren gel PRN, discussed correct application for effective analgesia   -Advised to continue exercises and stretches  -Patient will try PT    Anxiety  -Immensely improved since last visit  -Has great support and hobbies including bowling, teaching youth group, and bible study    Encounter for screening mammogram for malignant neoplasm of breast  -Last mammogram in November 2022 was BIRADS-1 negative  -Ordered MAMMO SCREEN BILAT/TOMO; Future; Expected date: 04/15/2022    Other orders  -     [facility]B FOLLOW UP IN FAMILY MEDICINE  -     [facility]B FOLLOW UP IN FAMILY MEDICINE; Future; Expected date: 10/15/2022  -     TDAP VACCINE >7YO IM    Disposition: Office visit in 6 months for follow up.    Seen and discussed with attending physician Dr. Charm Barges.    Wyn Forster, MD

## 2022-04-16 ENCOUNTER — Encounter: Admit: 2022-04-16 | Discharge: 2022-04-16 | Payer: No Typology Code available for payment source

## 2022-04-18 ENCOUNTER — Encounter: Admit: 2022-04-18 | Discharge: 2022-04-18 | Payer: No Typology Code available for payment source

## 2022-04-18 DIAGNOSIS — E785 Hyperlipidemia, unspecified: Secondary | ICD-10-CM

## 2022-04-18 MED ORDER — ATORVASTATIN 40 MG PO TAB
40 mg | ORAL_TABLET | Freq: Every day | ORAL | 0 refills | Status: AC
Start: 2022-04-18 — End: ?
  Filled 2022-04-22: qty 90, 90d supply, fill #1

## 2022-04-22 ENCOUNTER — Encounter: Admit: 2022-04-22 | Discharge: 2022-04-22 | Payer: No Typology Code available for payment source

## 2022-06-04 ENCOUNTER — Ambulatory Visit: Admit: 2022-06-04 | Discharge: 2022-06-04 | Payer: No Typology Code available for payment source

## 2022-06-04 ENCOUNTER — Encounter: Admit: 2022-06-04 | Discharge: 2022-06-04 | Payer: No Typology Code available for payment source

## 2022-06-04 DIAGNOSIS — R6889 Other general symptoms and signs: Secondary | ICD-10-CM

## 2022-06-04 DIAGNOSIS — D219 Benign neoplasm of connective and other soft tissue, unspecified: Secondary | ICD-10-CM

## 2022-06-04 DIAGNOSIS — N83209 Unspecified ovarian cyst, unspecified side: Secondary | ICD-10-CM

## 2022-06-04 DIAGNOSIS — H5213 Myopia, bilateral: Secondary | ICD-10-CM

## 2022-06-04 DIAGNOSIS — H40003 Preglaucoma, unspecified, bilateral: Secondary | ICD-10-CM

## 2022-06-04 NOTE — Progress Notes
Body mass index is 31.45 kg/m.       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  Unknown FHx (foster care as a child).  Baseline OCTnerve today was normal overall; monitor for change        Next Optometry visit -- 1 yr  Test  Comments   Refraction  [x]$  AutoRx     Topography  []$  Atlas    []$  Pentacam    OCT  []$  Mac   [x]$  Nerve   []$  Ant seg    Schirmer  []$  1 Drop of Proparacaine   []$  No numbing drops    IOP  []$  Tono   [x]$  iCare   []$  Applanate    Pachymetry  []$     HVF  []$  Sita Fast   []$  24-2       []$  Standard   []$  10-2    Optos Photos  []$  Standard  []$  FAF (autofluor)        Dilate  [x]$  Tropic 1% & Phenyl 2.5%   Tropic (only)   []$  0.5%    []$  1%   []$  Other (see comments) Due for DFE

## 2022-06-04 NOTE — Assessment & Plan Note
Updated glasses prescription given at today's exam   Interested in PAL this year

## 2022-06-15 ENCOUNTER — Encounter: Admit: 2022-06-15 | Discharge: 2022-06-15 | Payer: No Typology Code available for payment source

## 2022-07-02 ENCOUNTER — Ambulatory Visit: Admit: 2022-07-02 | Discharge: 2022-07-03 | Payer: BC Managed Care – PPO

## 2022-07-02 ENCOUNTER — Encounter: Admit: 2022-07-02 | Discharge: 2022-07-02 | Payer: No Typology Code available for payment source

## 2022-07-02 DIAGNOSIS — Z3042 Encounter for surveillance of injectable contraceptive: Secondary | ICD-10-CM

## 2022-07-02 MED ORDER — MEDROXYPROGESTERONE 150 MG/ML IM SYRG
150 mg | Freq: Once | INTRAMUSCULAR | 0 refills | Status: CP
Start: 2022-07-02 — End: ?
  Administered 2022-07-02: 18:00:00 150 mg via INTRAMUSCULAR

## 2022-07-02 NOTE — Progress Notes
Pt presents to clinic for scheduled depo-provera injection. Pt on time for depo. No complaints today. Depo given per protocol. Pt tolerated injection well and had no further questions or concerns at this time.     Next depo appointment scheduled and pt discharged in stable condition.

## 2022-07-27 ENCOUNTER — Emergency Department: Admit: 2022-07-27 | Discharge: 2022-07-27 | Payer: No Typology Code available for payment source

## 2022-07-27 ENCOUNTER — Encounter: Admit: 2022-07-27 | Discharge: 2022-07-27 | Payer: No Typology Code available for payment source

## 2022-07-27 ENCOUNTER — Emergency Department: Admit: 2022-07-27 | Discharge: 2022-07-28 | Payer: BC Managed Care – PPO

## 2022-07-27 DIAGNOSIS — R6889 Other general symptoms and signs: Secondary | ICD-10-CM

## 2022-07-27 DIAGNOSIS — D219 Benign neoplasm of connective and other soft tissue, unspecified: Secondary | ICD-10-CM

## 2022-07-27 DIAGNOSIS — M4802 Spinal stenosis, cervical region: Secondary | ICD-10-CM

## 2022-07-27 DIAGNOSIS — N83209 Unspecified ovarian cyst, unspecified side: Secondary | ICD-10-CM

## 2022-07-27 LAB — POC CREATININE, RAD: CREATININE, POC: 0.6 mg/dL (ref 0.4–1.00)

## 2022-07-27 LAB — HIGH SENSITIVITY TROPONIN I 0 HOUR: HIGH SENSITIVITY TROPONIN I 0 HOUR: 0 ng/L (ref <12)

## 2022-07-27 LAB — COMPREHENSIVE METABOLIC PANEL
ALBUMIN: 4.1 g/dL (ref 3.5–5.0)
ALK PHOSPHATASE: 65 U/L (ref 25–110)
ALT: 12 U/L (ref 7–56)
ANION GAP: 10 K/UL (ref 3–12)
AST: 15 U/L — ABNORMAL LOW (ref 7–40)
BLD UREA NITROGEN: 8 mg/dL (ref 7–25)
CALCIUM: 9.1 mg/dL — ABNORMAL LOW (ref 8.5–10.6)
CO2: 24 MMOL/L (ref 21–30)
CREATININE: 0.5 mg/dL (ref 0.4–1.00)
EGFR: >60 mL/min (ref >60)
POTASSIUM: 3.9 MMOL/L (ref 3.5–5.1)
SODIUM: 140 MMOL/L — ABNORMAL LOW (ref 137–147)
TOTAL BILIRUBIN: 0.3 mg/dL (ref 0.3–1.2)
TOTAL PROTEIN: 8 g/dL — ABNORMAL HIGH (ref 6.0–8.0)

## 2022-07-27 LAB — CBC AND DIFF
HEMOGLOBIN: 12 g/dL (ref 12.0–15.0)
RBC COUNT: 4.7 M/UL (ref 4.0–5.0)
WBC COUNT: 3.9 K/UL — ABNORMAL LOW (ref 4.5–11.0)

## 2022-07-27 LAB — HIGH SENSITIVITY TROPONIN I 2 HOUR: HIGH SENSITIVITY TROPONIN I 2 HOUR: 0 ng/L — ABNORMAL HIGH (ref <12)

## 2022-07-27 LAB — POC GLUCOSE: POC GLUCOSE: 106 mg/dL — ABNORMAL HIGH (ref 70–100)

## 2022-07-27 LAB — PTT (APTT): PTT: 33 s (ref 24.0–36.5)

## 2022-07-27 LAB — POC PT/INR: INR POC: 1 (ref 0.8–1.2)

## 2022-07-27 LAB — MAGNESIUM: MAGNESIUM: 2.1 mg/dL — ABNORMAL HIGH (ref 1.6–2.6)

## 2022-07-27 MED ORDER — IOHEXOL 350 MG IODINE/ML IV SOLN
60 mL | Freq: Once | INTRAVENOUS | 0 refills | Status: CP
Start: 2022-07-27 — End: ?
  Administered 2022-07-27: 13:00:00 60 mL via INTRAVENOUS

## 2022-07-27 MED ORDER — SODIUM CHLORIDE 0.9 % IJ SOLN
50 mL | Freq: Once | INTRAVENOUS | 0 refills | Status: CP
Start: 2022-07-27 — End: ?
  Administered 2022-07-27: 13:00:00 50 mL via INTRAVENOUS

## 2022-07-27 NOTE — Acute Stroke Response
Name: Seven Challa   MRN: [id number]     DOB: 1976-04-03      Age: 47 y.o.  Admission Date: 07/27/2022     LOS: 0 days     Date of Service: 07/27/2022       Allergies:  Patient has no known allergies.    Type of Acute Stroke Response Team note: Consult    Stroke Activation Tier: Tier 1         Assessment & Plan    Chief Complaint: numbness and tingling  Assessment: Talar Ojeda is a 47 y.o. female with h/o ovarian cyst/fibroids presented with tingling to left scalp, LLE weakness upon standing and tingling to left hand.  Patient states this has been going on for 30 days and is present every day when she wakes up in the morning.  Typicall resolves, although today was lasting longer than normal.  LKW @ 0200, NIHSS 1 for sensory changes.  CT head negative for acute process.  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 Buford Dresser. Aurther Loft    History of Present Illness    History of Present Illness: Patient presents to ED today with continuing numbness/tingling upon waking in the AM with concern for stroke.  She states that it typically resolves after she is up and walking for a bit but today symptoms lasted longer than normal.  CT H/N showed moderate to severe stenosis of cervical region.  MRI recommended for full evaluation.    Review of Systems    A 14 point review of systems was negative except for: Musculoskeletal: positive for muscle weakness  Neurological: positive for paresthesia and weakness      Stroke Activation Summary    Patient Arrival: [phone]  ASRT Arrival: [phone]  Location of Response : [facility]   Page Received: [phone]    Clinical Presentation: Sensory changes    Signs & Symptoms: Last Known Well  Last Known Well - Date: 07/27/22  Last Known Well - Time: 0200     CT/CTP/CTA:     Head:   1.  No acute intracranial hemorrhage or calvarial fracture.   2.  Partially empty appearance of the sella, which is commonly an   incidental finding, though can also be seen with idiopathic intracranial   hypertension (in the appropriate clinical setting).     Cervical Spine:   1.  No acute cervical fracture or traumatic malalignment.   2.  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 Consider MRI cervical spine for further   evaluation as clinically indicated.          BP: 111/59 (04/08 0758)  Pulse: 68 (04/08 0758)  Respirations: 14 PER MINUTE (04/08 0758)  SpO2: 97 % (04/08 0758)  Height: 165.1 cm (5' 5) (04/08 1610)    NIHSS Completed at: 56      NIH Stroke Scale Item  Scoring Definition  Score    1a. LOC  0=alert and responsive   1=arousable to minor stimulation   2=arousable only to painful stimulation   3=reflex responses or unrousable  0    1b. LOC questions-as patient?s age and month. Must be exact.  0=both correct   1=one correct (or dysarthria, intubated, foreign language)   2=neither correct  0    1c. Commands-open/close eyes, grip and release non-paretic hand (other 1 step commands or mimic OK)  0=both correct (ok if impaired by weakness)   1=one correct   2=neither correct  0    2. Best Gaze-horizontal EOM by voluntary or Doll?s  0=normal   1=partial gaze palsy (abnormal gaze in one or both eyes)   2=forced eye deviation or total paresis which cannot be overcome by Doll?s  0    3. Visual Field-use visual threat if necessary. If monocular, score field of good eye  0=no visual loss   1=partial hemianopia, quadrantanopia, extinction   2=complete hemianopia   3=bilateral hemianopia or blindness  0    4. Facial Palsy-if stuporous, check symmetry of grimace to pain  0=normal   1=minor paralysis, flat NLF, asymm smile   2=partial paralysis (lower face=UMN)   3=complete paralysis (upper and lower face)  0    5. Motor Arm-arms outstretched 90 deg (sitting) or 45 deg (supine) for 10 seconds. Encourage best effort.  0=no drift x 10 seconds   1=drift but doesn?t hit bed   2=some antigravity effort, but can?t sustain   3=no antigravity effort, but even minimal mvt counts   4=no movement at all   X=unable to assess due to amputation, fusion, etc  L/R   0/0    6. Motor Leg-raise leg to 30 degrees supine x 5 seconds  0=no drift x 5 seconds   1=drift but doesn?t hit bed   2=some antigravity effort, but can?t sustain   3=no antigravity effort, but even minimal mvt counts   4=no movement at all   X=unable to assess due to amputation, fusion, etc  L/R   0/0    7. Limb Ataxia-check finger-nose- finger; heel-shin; and score only if out of proportion to paralysis  0=no ataxia (or aphasic, hemiplegic)   1=ataxia in upper or lower extremity   2=ataxia in upper AND lower extremity   X=unable to assess due to amputation, fusion, etc  0   8. Sensory-use safety pin. Check grimace or withdrawal if stuporous. Score only stroke- related losses  0=normal   1=mild-mod unilateral loss but patient aware of touch 9or aphasic, confused)   2=total loss, pt unaware of touch. Coma, bilateral loss  1   9. Best Language-describe cookie jar picture, name objects, read sentences. May use repeating, writing, stereognosis  0=normal   1=mild-mod aphasia (diff but partly comprehensible)   2=severe aphasia (almost no info exchanged)   3=mute, global aphasia, coma. No 1 step commands  0    10. Dysarthria-read list of words  0=normal or intubated or other physical barrier  1=mild-mod; slurred but intelligible   2=severe; unintelligible or mute  0    11. Extinction/Neglect- simultaneously touch patient on both hands, show fingers in both visual fields, ask about deficit, left hand  0=normal, none detected. (visual loss alone)   1=neglects  or extinguishes to double stimulation in any modality   2=profound neglect in more than one modality  0    Score    1     Was the patient 4.5-9 hrs from last known well or a wake-up stroke? No    Was IV tenecteplase given? No    The patient was not a thrombolytic candidate due to Stroke severity too mild (non-disabling)    Advanced imaging was interpreted at [phone]  The patient was not a thrombectomy candidate due to no LVO    Dysphagia screen:  Did Patient Pass The Swallow Screen Part I?: Yes     Did Patient Pass The Swallow Screen Part II?: Yes     Did Patient Pass The Swallow Screen?: Yes       Performed by nursing staff and passed or Failed screen by nursing staff and awaiting ST evaluation        Health History    Medical History:   Diagnosis Date    Fibroids     Ovarian cyst     Unspecified problems with internal organs      Surgical History:   Procedure Laterality Date    LAPAROSCOPIC CHOLECYSTECTOMY N/A 04/26/2020    Performed by Freund, Alecia Lemming., MD at [facility] OR     Family History   Problem Relation Age of Onset    High Cholesterol Mother     Hearing Loss Mother     Learning Disability Mother     Asthma Brother     Asthma Maternal Grandmother     Cancer-Lung Maternal Grandmother     Cancer-Prostate Maternal Grandfather     Arthritis Neg Hx     Bleeding Disorders Neg Hx     Cancer-Colon Neg Hx     Cancer-Breast Neg Hx     Cancer-Ovarian Neg Hx     Cancer-Uterine Neg Hx     Cleft Lip Neg Hx     Cervical Cancer Neg Hx     Cleft Palate Neg Hx     Diabetes Neg Hx     Heart Disease Neg Hx     Migraines Neg Hx     Miscarriages Neg Hx     Rashes/Skin Problems Neg Hx     Seizures Neg Hx     Thyroid Disease Neg Hx     VTE Neg Hx     Unknown to Patient Neg Hx     Blindness Neg Hx     Cataract Neg Hx     Glaucoma Neg Hx     Macular Degen Neg Hx     Retinal Detachment Neg Hx      Social History     Socioeconomic History    Marital status: Separated    Number of children: 10   Occupational History     Employer: [employer]   Tobacco Use    Smoking status: Never    Smokeless tobacco: Never   Vaping Use    Vaping status: Never Used   Substance and Sexual Activity    Alcohol use: No    Drug use: No    Sexual activity: Yes     Partners: Male     Birth control/protection: Condom, Injection   Social History Narrative    Lives with 4 daughters. No pets.               Medications:    PRN Medications:      Physical  Exam     HEENT: normocephalic, eyes open with no discharge, nares patent, oropharynx is clear with no lesions, palate intact  CV: regular rate, distal pulses palpable  Chest: normal configuration, equal chest rise bilaterally  Ab: soft, non-tender, no masses, no organomegaly  Skin: no rashes or lesions    Extended Neuro Exam:    Mental status: alert, oriented to person/place/time  Speech:    Normal Abnormal   Fluency x    Comprehension x    Articulation x    Repetition x    Naming x        Cranial Nerves:    Normal Abnormal   II x    III, IV, VI  Left face/head   V x    VII                         x                             VIII x    IX, X x    XI x    XII x        Muscle/motor:   Tone: nml  Bulk: nml  Fasciculations: none  Pronator drift: none     NF NE SA EF EE WE WF FF FE FA TA HF HA HE KF KE DF PF   R 5 5 5 5 5 5 5 5 5 5 5 5 5 5 5 5 5 5    L   5 5 5 5 5 5 5 5 5 5 5 5 5 5 5 5        Sensation:    Normal RUE LUE RLE LLE   Light Touch x       Pin Prick x       Temperature x       Vibration x       Proprioception x           Coordination:    Normal Abnormal Right Abnormal Left   Finger to Nose x     Rapid alternating  x     Heel to Shin x     Finger tap x     Foot tap x       Gait and Station:  Regular gait: normal  Toe walk: normal  Heel walk: normal  Tandem gait: normal  Romberg: not present    Reflexes:    Right Left   Triceps 2 2   Biceps 2 2   Brachioradialis 2 2   Patella 2 2   Ankle 2 2   Plantar down down          Lab/Radiology/Other Diagnostic Tests:  Pertinent labs reviewed  POC Glucose (Download): (!) 106 (07/27/22 0750)  Pertinent radiology reviewed.      I spent 47 minutes of critical care time managing the care of this patient. Evanie is critically ill with an acute stroke vs severe cervical stenosis, her cares included: preparing to see the patient, detailed neurologic and systems exam including NIHSS and reflex exam, obtaining and/or reviewing separately obtained history, medication review, laboratory data review and interpretation, review of available and obtained imaging, referring and communication with other health care professionals (when not separately reported), documenting clinical information in the electronic or other health record and Independently interpreting results (not separately reported) and communicating results to the patient/family/caregiver.  Hettie Holstein, APRN-NP  Vascular Neurology  Available on [facility]

## 2022-07-27 NOTE — Acute Stroke Response
RN Stroke Activation Summary    Date of Service:  07/27/2022  Cindy Snyder is a 47 y.o.  female.     DOB: 11/21/1975                  MRN#:  [id number]  Allergies:  Patient has no known allergies.    Patient Arrival: [phone]  ASRT Arrival: [phone]  Location of Response : [facility]    Page Received: [phone]          Clinical Presentation: Sensory changes    Total Stroke Scale Score: 1    Signs & Symptoms: Last Known Well  Last Known Well - Date: 07/27/22  Last Known Well - Time: 0200     Dysphagia screen:  Patient Intubated?: No  Did Patient Pass The Swallow Screen Part I?: Yes     Did Patient Pass The Swallow Screen Part II?: Yes     Did Patient Pass The Swallow Screen?: Yes       Plan:    Summary:  Patient not a candidate for acute stroke intervention at this time.  Please see Acute Stroke Response Provider Note for further information.     Radiology/Interventional Delay  Decision to IR: No  Delays: No      No current facility-administered medications for this encounter.    N/A  IV Thrombolytic in the Scanner: No    Plan: further work up in ED    Call Completion: [phone]    RN handoff: Alcario Drought RN ED       Charlena Cross, RN

## 2022-07-27 NOTE — ED Notes
Pt resting comfortably in bed, denies any new complaints, need for restroom assistance, or further needs at this time. Pts call light in reach, bed in low locked position. Belongings with pt at bedside.

## 2022-07-27 NOTE — ED Notes
Pt reporting L hand/arm numbness and L leg weakness with L sided facial numbness when waking up. Pt did not feel these symptoms around 0200 when waking up for bathroom/drink of water. Pt denies hx of stroke, denies Ha/blurry vision

## 2022-07-27 NOTE — ED Notes
47 y/o female presents to ED 61 after stroke team activation. She states she came to ED because her chronic left head numbness was worse today and not improving after getting up and moving as she normally does. She endoreses left head numbness at this time. She denies any pain, and denies vision changes. She is neurologically intact. She is AAOx4. VSS. Answers questions appropriately. Respirations even and unlabored, skin warm/dry/intact. Pt resting comfortably on cart, call light in reach. Cardiac, BP, SPO2 monitor on pt at this time. RN awaiting further orders from team.     Medical History:   Diagnosis Date    Fibroids     Ovarian cyst     Unspecified problems with internal organs        Patient belongings: shirt, pants, shoes, lunch box

## 2022-07-28 NOTE — ED Notes
Discussed discharge paperwork and follow up instructions with pt, pt v/u and is agreeable to plan of care. PIV removed, catheter tip intact. Pt denies further questions. Pt ambulates off unit with steady gait, AOx4, breathing even and unlabored. All paperwork and belongings in pt possession at time of departure.

## 2022-07-29 ENCOUNTER — Encounter: Admit: 2022-07-29 | Discharge: 2022-07-29 | Payer: No Typology Code available for payment source

## 2022-07-29 NOTE — Telephone Encounter
Noted.Will Follow As Needed.

## 2022-07-29 NOTE — Telephone Encounter
ED Discharge Follow Up  Reached patient: NoMy Chart message sent per protocol   Patient Date of Birth: February 20, 1976  Admission Information  Hospital Name : [facility]  ED Admission Date: 07/27/22   ED Discharge Date: 07/27/22   Admission Diagnosis:  Numbness  Discharge Diagnosis: Cervical spinal stenosis   Hospital Services: Unplanned  Today's call is 2 (calendar) days post discharge    Medication Reconciliation  Changes to pre-ED visit medications? No  Were new prescriptions filled? N/A  Meds reviewed and reconciled? Yes    Current Outpatient Medications   Medication Instructions    atorvastatin (LIPITOR) 40 mg, Oral, DAILY    cyclobenzaprine (FLEXERIL) 5 mg, Oral, THREE TIMES DAILY    diclofenac sodium (VOLTAREN) 1 % topical gel Apply four grams topically to affected area four times daily.    fexofenadine (ALLEGRA) 60 mg, Oral, TWICE DAILY    fluticasone propionate (FLONASE) 50 mcg/actuation nasal spray, suspension 1 spray, Nasal, TWICE DAILY    simethicone (GAS RELIEF (SIMETHICONE)) 80 mg, Oral, EVERY  6 HOURS PRN       Scheduling Follow-up Appointment  Upcoming appointments:   Future Appointments   Date Time Provider Department Center   09/17/2022  9:30 AM [facility] NURSE [facility]   10/15/2022  4:20 PM Wyn Forster, MD [facility]     When was patients last PCP visit: 04/15/2022  PCP primary location: [facility]  PCP appointment scheduled? No, My Chart message sent per protocol   Specialist appointment scheduled? Yes, with OB/GYN 09/17/22  Is assistance with transportation needed?  Unable to assess  MyChart message sent? Active in MyChart. MyChart message sent.  Artera text sent? No      ED Communication   Did patient call clinic prior to going to ED? No  Reason patient went to ED: Unable to obtain    Micah Noel, RN

## 2022-08-12 ENCOUNTER — Encounter: Admit: 2022-08-12 | Discharge: 2022-08-12 | Payer: No Typology Code available for payment source

## 2022-08-12 MED ORDER — CYCLOBENZAPRINE 5 MG PO TAB
5 mg | ORAL_TABLET | Freq: Every evening | ORAL | 3 refills | 30.00000 days | Status: AC | PRN
Start: 2022-08-12 — End: ?
  Filled 2022-08-12: qty 60, 60d supply, fill #1

## 2022-08-26 ENCOUNTER — Encounter: Admit: 2022-08-26 | Discharge: 2022-08-26 | Payer: No Typology Code available for payment source

## 2022-09-08 ENCOUNTER — Encounter: Admit: 2022-09-08 | Discharge: 2022-09-08 | Payer: No Typology Code available for payment source

## 2022-09-17 ENCOUNTER — Encounter: Admit: 2022-09-17 | Discharge: 2022-09-17 | Payer: No Typology Code available for payment source

## 2022-09-17 ENCOUNTER — Ambulatory Visit: Admit: 2022-09-17 | Discharge: 2022-09-18 | Payer: BC Managed Care – PPO

## 2022-09-17 MED ORDER — MEDROXYPROGESTERONE 150 MG/ML IM SYRG
150 mg | Freq: Once | INTRAMUSCULAR | 0 refills | Status: CP
Start: 2022-09-17 — End: ?
  Administered 2022-09-17: 15:00:00 150 mg via INTRAMUSCULAR

## 2022-09-17 NOTE — Progress Notes
Patient presents to clinic for depo injection. Patient in appropriate window. Depo given in right gluteal muscle. Patient tolerated injection well. No questions or concerns from patient. Provided patient with time and date of next depo injection. Patient verbalized understanding.

## 2022-09-29 ENCOUNTER — Ambulatory Visit: Admit: 2022-09-29 | Discharge: 2022-09-30 | Payer: BC Managed Care – PPO

## 2022-09-29 ENCOUNTER — Encounter: Admit: 2022-09-29 | Discharge: 2022-09-29 | Payer: No Typology Code available for payment source

## 2022-09-29 DIAGNOSIS — D219 Benign neoplasm of connective and other soft tissue, unspecified: Secondary | ICD-10-CM

## 2022-09-29 DIAGNOSIS — D539 Nutritional anemia, unspecified: Secondary | ICD-10-CM

## 2022-09-29 DIAGNOSIS — N83209 Unspecified ovarian cyst, unspecified side: Secondary | ICD-10-CM

## 2022-09-29 DIAGNOSIS — R6889 Other general symptoms and signs: Secondary | ICD-10-CM

## 2022-09-29 NOTE — Patient Instructions
Based off of your exam, it looks like you could have something called plantar fascitis.     Health Maintainence  The electronic medical record believes you may be due for some preventative care tasks, listed below:    Health Maintenance Due   Topic Date Due    BREAST CANCER SCREENING  03/10/2022       Please talk with your doctor about these topics or call [phone] to schedule an appointment to discuss these issues.    It was nice to see you in clinic today.    Wyn Forster, MD  Department of Family Medicine  [facility]

## 2022-10-01 ENCOUNTER — Encounter: Admit: 2022-10-01 | Discharge: 2022-10-01 | Payer: BC Managed Care – PPO

## 2022-10-01 ENCOUNTER — Encounter: Admit: 2022-10-01 | Discharge: 2022-10-01 | Payer: No Typology Code available for payment source

## 2022-10-07 ENCOUNTER — Encounter: Admit: 2022-10-07 | Discharge: 2022-10-07 | Payer: No Typology Code available for payment source

## 2022-10-15 ENCOUNTER — Encounter: Admit: 2022-10-15 | Discharge: 2022-10-15 | Payer: No Typology Code available for payment source

## 2022-10-29 ENCOUNTER — Encounter: Admit: 2022-10-29 | Discharge: 2022-10-29 | Payer: BC Managed Care – PPO

## 2022-10-29 ENCOUNTER — Encounter: Admit: 2022-10-29 | Discharge: 2022-10-29 | Payer: No Typology Code available for payment source

## 2022-11-11 ENCOUNTER — Encounter: Admit: 2022-11-11 | Discharge: 2022-11-11 | Payer: No Typology Code available for payment source

## 2022-11-19 ENCOUNTER — Encounter: Admit: 2022-11-19 | Discharge: 2022-11-19 | Payer: BC Managed Care – PPO

## 2022-11-19 ENCOUNTER — Encounter: Admit: 2022-11-19 | Discharge: 2022-11-19 | Payer: No Typology Code available for payment source

## 2022-12-03 ENCOUNTER — Encounter: Admit: 2022-12-03 | Discharge: 2022-12-03 | Payer: No Typology Code available for payment source

## 2022-12-03 ENCOUNTER — Ambulatory Visit: Admit: 2022-12-03 | Discharge: 2022-12-04 | Payer: BC Managed Care – PPO

## 2022-12-03 MED ORDER — MEDROXYPROGESTERONE 150 MG/ML IM SYRG
150 mg | Freq: Once | INTRAMUSCULAR | 0 refills | Status: CP
Start: 2022-12-03 — End: ?

## 2022-12-03 NOTE — Progress Notes
Pt arrived for depo injection. Last injection 09/17/22. Pt has no question or concerns surrounding this medication. Administered without complications. Next appt scheduled for time frame of 10/31-11/14.    Scheduled annual appt and next depo together.    Future Appointments   Date Time Provider Department Center   02/24/2023  1:30 PM Domingo Sep, MD [facility]

## 2022-12-14 ENCOUNTER — Encounter: Admit: 2022-12-14 | Discharge: 2022-12-14 | Payer: No Typology Code available for payment source

## 2022-12-16 ENCOUNTER — Encounter: Admit: 2022-12-16 | Discharge: 2022-12-16 | Payer: No Typology Code available for payment source

## 2022-12-23 ENCOUNTER — Encounter: Admit: 2022-12-23 | Discharge: 2022-12-23 | Payer: No Typology Code available for payment source

## 2023-01-18 ENCOUNTER — Encounter: Admit: 2023-01-18 | Discharge: 2023-01-18 | Payer: No Typology Code available for payment source

## 2023-03-03 ENCOUNTER — Encounter: Admit: 2023-03-03 | Discharge: 2023-03-03 | Payer: PRIVATE HEALTH INSURANCE

## 2023-03-16 ENCOUNTER — Ambulatory Visit: Admit: 2023-03-16 | Discharge: 2023-03-17 | Payer: BC Managed Care – PPO

## 2023-03-16 ENCOUNTER — Encounter: Admit: 2023-03-16 | Discharge: 2023-03-16 | Payer: PRIVATE HEALTH INSURANCE

## 2023-03-16 DIAGNOSIS — Z309 Encounter for contraceptive management, unspecified: Secondary | ICD-10-CM

## 2023-03-16 DIAGNOSIS — Z308 Encounter for other contraceptive management: Secondary | ICD-10-CM

## 2023-03-16 MED ORDER — MEDROXYPROGESTERONE 150 MG/ML IM SYRG
150 mg | Freq: Once | INTRAMUSCULAR | 0 refills | Status: CP
Start: 2023-03-16 — End: ?

## 2023-03-16 NOTE — Progress Notes
Pt came in today to receive her DepoProvera injection, 150mg  given IM. Patient tolerated injection well, no bleeding or redness at injection site.  Site: L gluteal  Lot #: [id number]  Exp Date: 16109604  Next injection due: 2/11 to 2/25  Urine pregnancy test: neg  Mfg: [employer]       Pt is scheduled for WWE on 05/26/2023.  Depo due at that visit.

## 2023-03-17 DIAGNOSIS — Z01812 Encounter for preprocedural laboratory examination: Secondary | ICD-10-CM

## 2023-04-06 ENCOUNTER — Encounter: Admit: 2023-04-06 | Discharge: 2023-04-06 | Payer: PRIVATE HEALTH INSURANCE

## 2023-04-13 ENCOUNTER — Encounter: Admit: 2023-04-13 | Discharge: 2023-04-13 | Payer: PRIVATE HEALTH INSURANCE

## 2023-04-15 ENCOUNTER — Encounter: Admit: 2023-04-15 | Discharge: 2023-04-15 | Payer: PRIVATE HEALTH INSURANCE

## 2023-04-15 MED FILL — CYCLOBENZAPRINE 5 MG PO TAB: 5 mg | ORAL | 60 days supply | Qty: 60 | Fill #2 | Status: CP

## 2023-04-16 ENCOUNTER — Encounter: Admit: 2023-04-16 | Discharge: 2023-04-16 | Payer: PRIVATE HEALTH INSURANCE

## 2023-04-16 MED FILL — ATORVASTATIN 40 MG PO TAB: 40 mg | ORAL | 90 days supply | Qty: 90 | Fill #1 | Status: CP

## 2023-04-29 ENCOUNTER — Encounter: Admit: 2023-04-29 | Discharge: 2023-04-29 | Payer: PRIVATE HEALTH INSURANCE

## 2023-05-06 ENCOUNTER — Encounter: Admit: 2023-05-06 | Discharge: 2023-05-06 | Payer: PRIVATE HEALTH INSURANCE

## 2023-05-06 ENCOUNTER — Ambulatory Visit: Admit: 2023-05-06 | Discharge: 2023-05-07 | Payer: PRIVATE HEALTH INSURANCE

## 2023-05-06 ENCOUNTER — Ambulatory Visit: Admit: 2023-05-06 | Discharge: 2023-05-07 | Payer: Vision Other Private Insurance

## 2023-05-06 VITALS — BP 126/67 | HR 58 | Temp 98.20000°F | Resp 16 | Ht 65.0 in | Wt 193.2 lb

## 2023-05-06 DIAGNOSIS — M7122 Synovial cyst of popliteal space [Baker], left knee: Principal | ICD-10-CM

## 2023-05-06 NOTE — Progress Notes
Family Medicine Clinic Visit Note     Encounter Date: 05/06/2023   Patient Name: Cindy Snyder   Date of Birth: 1976-01-20   MRN: [id number]   Encounter Provider Earnest Bailey, MD   Primary Care Physician:  Wyn Forster, MD (General)     SUBJECTIVE     Chief Complaint   Patient presents with    Knee Swelling     Left knee pain and swelling, onset about 5 days ago. Pt reports she woke up with it.        History of Present Illness     Cindy Snyder is a 48 y.o. female presenting to clinic on 05/06/2023 for left knee pain. Starting Saturday she felt like she was going to fall and felt imbalanced but did not fall. She describes a pulling feeling and it hurts to extend her knee. Pain is in the back of her knee, thinks it feels swollen, tight.     Denies injury.   Denies fevers, N/V, rashes.   This has never happened before.   The pain has been about the same since Saturday.  The pain is so bad it makes her limp.  She has been wrapping her knee, compression socks feel like it made it worse.  She has not been icing it.   She feels the pulling sensation when she is walking. She feels like there is sometimes a sharp pain in her knee when she steps down.   Her knee does not feel unstable.  Has needed PT in her knees previously for pain. Has had a work injury to a knee before but can't remember which one.   Feeling of fullness.    OBJECTIVE     Vitals:    05/06/23 1557   BP: 126/67   BP Source: Arm, Left Upper   Pulse: 58   Temp: 36.8 ?C (98.2 ?F)   Resp: 16   SpO2: 100%   TempSrc: Skin   PainSc: Five  Comment: pt reports pain comes and goes   Weight: 87.6 kg (193 lb 3.2 oz)   Height: 165.1 cm (5' 5)     Wt Readings from Last 4 Encounters:   05/06/23 87.6 kg (193 lb 3.2 oz)   09/29/22 86.8 kg (191 lb 6.4 oz)   07/27/22 83.9 kg (185 lb)   06/04/22 85.7 kg (189 lb)     Body mass index is 32.15 kg/m?Marland Kitchen    Medications      atorvastatin (LIPITOR) 40 mg tablet Take one tablet by mouth daily. Indications: excessive fat in the blood    cyclobenzaprine (FLEXERIL) 5 mg tablet Take one tablet by mouth at bedtime as needed for Muscle Cramps. Indications: muscle spasm    diclofenac sodium (VOLTAREN) 1 % topical gel Apply four grams topically to affected area four times daily.    fexofenadine (ALLEGRA) 60 mg tablet Take one tablet by mouth twice daily.    fluticasone propionate (FLONASE) 50 mcg/actuation nasal spray, suspension Apply one spray into nose as directed twice daily.    simethicone (GAS RELIEF (SIMETHICONE)) 80 mg chew tablet Chew one tablet by mouth every 6 hours as needed for Flatulence.       Physical Exam     Physical Exam  Constitutional:       Appearance: Normal appearance.   Cardiovascular:      Rate and Rhythm: Normal rate and regular rhythm.   Pulmonary:      Effort: Pulmonary effort is normal.  Breath sounds: Normal breath sounds.   Musculoskeletal:      Right knee: Normal.      Left knee: Swelling present. Tenderness present. No LCL laxity, MCL laxity, ACL laxity or PCL laxity.     Instability Tests: Anterior drawer test negative. Posterior drawer test negative.        Legs:       Comments: Patient with fullness in the back of left knee tender to palpation, left greater than right.  No signs of instability or laxity in her knee.  No tenderness along her medial or lateral joint lines or the front of the knee.   Neurological:      Mental Status: She is alert.           ASSESSMENT & PLAN     Assessment & Plan  Synovial cyst of left popliteal space  Patient with pain in the back of left knee since Saturday without signs of injury, fever or worsening.  Patient describes pulling sensation when walking worse with extension.  On exam there is some fullness in the back of her left knee compared to the right side.  Likely a Baker's cyst will evaluate with x-rays.  Discussed with patient that this gets worse or it dramatically grows in size to let us know immediately and we can send her to sports med clinic and have it drained.  At this point treatment focuses on conservative management.  Will send patient to physical therapy as well as get knee x-rays to rule out other causes of knee pain.  > Okay to use Tylenol alternating with ibuprofen for pain management.  Patient has Voltaren gel which she can use on the back of her knee as needed.  > Patient given handout explaining treatment of a Baker's cyst and what this is.  Orders:    KNEE 3 VIEWS LEFT; Future    AMB REFERRAL TO PHYSICAL THERAPY       Return precautions discussed, questions sought and answered. Patient expressed understanding and agreed with treatment plan.     Patient was Seen and discussed with attending physician, Dr. Vonita Moss    Future Apts:  To-Do List       Future Appointments Provider Department Dept Phone    05/07/2023 4:30 PM TOMO/PROCEDURE ROOM Imaging, Mammography: [facility] [phone]    05/26/2023 1:30 PM Domingo Sep, MD Obstetrics and Gynecology: [facility] [phone]    08/20/2023 3:40 PM Wyn Forster, MD Family Medicine: [facility] [phone]    08/23/2023 3:40 PM Wyn Forster, MD Family Medicine: [facility] [phone]             Audria Nine MD  PGY-1, Family Medicine Resident

## 2023-05-06 NOTE — Progress Notes
ATTESTATION    I was personally present for the key portions of the E/M visit, discussed case with resident  Earnest Bailey, MD and concur with the resident's documentation of history, physical exam, assessment, and treatment plan unless otherwise noted.    Staff name:  Coralie Common, DO Date: 05/06/23

## 2023-05-06 NOTE — Patient Instructions
Shanele,  It was nice to see you today. Thank you for coming into clinic.    If you had labs drawn, your results will be given via mychart or phone. Please be patient as I wait for all of your labs to result. Any urgent results will be communicated to you ASAP.     Please ensure you schedule a yearly preventive health visit that is separate from your visits for any other health conditions.    If you have any questions or concerns, please contact the clinic at [phone] or via the MyChart patient portal.   Note, MyChart is NOT appropriate for emergency communications, as it is NOT monitored 24/7.  It is also NOT monitored over weekends, holidays or extended holiday weekends.    Take care,   Earnest Bailey, MD

## 2023-05-07 ENCOUNTER — Ambulatory Visit: Admit: 2023-05-07 | Discharge: 2023-05-08 | Payer: BC Managed Care – PPO

## 2023-05-07 ENCOUNTER — Encounter: Admit: 2023-05-07 | Discharge: 2023-05-07 | Payer: PRIVATE HEALTH INSURANCE

## 2023-05-24 ENCOUNTER — Encounter: Admit: 2023-05-24 | Discharge: 2023-05-24 | Payer: PRIVATE HEALTH INSURANCE

## 2023-05-27 ENCOUNTER — Encounter: Admit: 2023-05-27 | Discharge: 2023-05-27 | Payer: PRIVATE HEALTH INSURANCE

## 2023-06-17 ENCOUNTER — Encounter: Admit: 2023-06-17 | Discharge: 2023-06-17 | Payer: PRIVATE HEALTH INSURANCE

## 2023-06-17 ENCOUNTER — Ambulatory Visit: Admit: 2023-06-17 | Discharge: 2023-06-18 | Payer: BC Managed Care – PPO

## 2023-07-10 ENCOUNTER — Encounter: Admit: 2023-07-10 | Discharge: 2023-07-10 | Payer: PRIVATE HEALTH INSURANCE

## 2023-07-16 ENCOUNTER — Encounter: Admit: 2023-07-16 | Discharge: 2023-07-16

## 2023-07-16 MED FILL — CYCLOBENZAPRINE 5 MG PO TAB: 5 mg | ORAL | 60 days supply | Qty: 60 | Fill #3 | Status: CP

## 2023-07-16 MED FILL — ATORVASTATIN 40 MG PO TAB: 40 mg | ORAL | 90 days supply | Qty: 90 | Fill #1 | Status: AC

## 2023-08-10 ENCOUNTER — Encounter: Admit: 2023-08-10 | Discharge: 2023-08-10 | Payer: PRIVATE HEALTH INSURANCE

## 2023-08-20 ENCOUNTER — Encounter: Admit: 2023-08-20 | Discharge: 2023-08-20 | Payer: PRIVATE HEALTH INSURANCE

## 2023-08-20 ENCOUNTER — Ambulatory Visit: Admit: 2023-08-20 | Discharge: 2023-08-21 | Payer: BLUE CROSS/BLUE SHIELD

## 2023-08-21 ENCOUNTER — Encounter: Admit: 2023-08-21 | Discharge: 2023-08-21 | Payer: PRIVATE HEALTH INSURANCE

## 2023-08-22 ENCOUNTER — Encounter: Admit: 2023-08-22 | Discharge: 2023-08-22 | Payer: PRIVATE HEALTH INSURANCE

## 2023-08-23 ENCOUNTER — Encounter: Admit: 2023-08-23 | Discharge: 2023-08-23 | Payer: PRIVATE HEALTH INSURANCE

## 2023-08-23 ENCOUNTER — Ambulatory Visit: Admit: 2023-08-23 | Discharge: 2023-08-24 | Payer: BLUE CROSS/BLUE SHIELD

## 2023-08-23 MED FILL — FLUTICASONE PROPIONATE 50 MCG/ACTUATION NA SPSN: 50 mcg/actuation | NASAL | 90 days supply | Qty: 48 | Fill #1 | Status: AC

## 2023-08-25 ENCOUNTER — Ambulatory Visit: Admit: 2023-08-25 | Discharge: 2023-08-26 | Payer: BLUE CROSS/BLUE SHIELD

## 2023-08-25 ENCOUNTER — Encounter: Admit: 2023-08-25 | Discharge: 2023-08-25 | Payer: PRIVATE HEALTH INSURANCE

## 2023-08-30 ENCOUNTER — Ambulatory Visit: Admit: 2023-08-30 | Discharge: 2023-08-31 | Payer: BLUE CROSS/BLUE SHIELD

## 2023-08-30 ENCOUNTER — Encounter: Admit: 2023-08-30 | Discharge: 2023-08-30 | Payer: PRIVATE HEALTH INSURANCE

## 2023-08-31 ENCOUNTER — Encounter: Admit: 2023-08-31 | Discharge: 2023-08-31 | Payer: PRIVATE HEALTH INSURANCE

## 2023-09-01 ENCOUNTER — Encounter: Admit: 2023-09-01 | Discharge: 2023-09-01 | Payer: PRIVATE HEALTH INSURANCE

## 2023-09-01 MED FILL — METFORMIN 500 MG PO TAB: 500 mg | ORAL | 97 days supply | Qty: 187 | Fill #1 | Status: AC

## 2023-09-06 ENCOUNTER — Encounter: Admit: 2023-09-06 | Discharge: 2023-09-06 | Payer: PRIVATE HEALTH INSURANCE

## 2023-09-06 ENCOUNTER — Ambulatory Visit: Admit: 2023-09-06 | Discharge: 2023-09-07 | Payer: BLUE CROSS/BLUE SHIELD

## 2023-09-06 NOTE — Progress Notes
 Annual Gynecologic Visit  CC - Well Woman and Physical      Date of service: 09/08/2023    Subjective:     History of present illness - Cindy Snyder is a 48 y.o. G10P100010 here today for Well Woman and Physical       Patient reports she is doing well. No specific concerns today. She reports she exercises more days than not and eats healthy.     Would like refill on Depo Provera. She was due for Depo in Feb but had to cancel due to snow. Has not been sexually active in 2 years.    08/2021 NILM, HPV neg  2/21 colpo LSIL/CIN1, ECC neg  12/20 NILM HPV other +  10/19 NILM HPV other +    Menstrual history  First day of your last menstrual period:: (Patient-Rptd) 07/20/2023  Length of cycle (days between last day of period and first day of your next period):: (Patient-Rptd) Currently spotting  How many days does your period last?: (Patient-Rptd) 7  Describe your menstrual flow:: (Patient-Rptd) Varies  Age of first period:: (Patient-Rptd) 9  Is your period painful?: (Patient-Rptd) No  Are you currently or have you previously taken hormone therapy?: (Patient-Rptd) No    Pap smear history  Result:: (Patient-Rptd) Abnormal  At what office did you get tested?: (Patient-Rptd) [facility]  Do you have a history of an abnormal Pap smear and/or HPV test?: (Patient-Rptd) Yes  If yes, have you had a colposcopy?: (Patient-Rptd) Yes     Obstetrical history  Total number of pregnancies:: (Patient-Rptd) 9  Full term:: (Patient-Rptd) 9  Preterm:: (Patient-Rptd) 0  Number of vaginal deliveries:: (Patient-Rptd) 10  Number of Cesarean sections:: (Patient-Rptd) 0  Number of living children:: (Patient-Rptd) 10  Miscarriages:: (Patient-Rptd) 0  Ectopic pregnancies:: (Patient-Rptd) 0  Surgical abortions:: (Patient-Rptd) 0    Social History  Do you wear sunscreen?: (Patient-Rptd) Yes  How often do you wear a seat belt?: (Patient-Rptd) Always  Do you exercise?: (Patient-Rptd) Yes  If yes, how often?: (Patient-Rptd) Always  Do you consider your diet healthy?: (Patient-Rptd) Unsure     Social History     Substance and Sexual Activity   Sexual Activity Not Currently    Partners: Male    Birth control/protection: Condom, Injection       Over the past 2 weeks, how often have you been bothered by any of the following problems?  Little interest or pleasure in doing things: Not at all  Feeling down, depressed or hopeless: Not at all  PHQ-2 Score: 0  PHQ-2 Score less than 3: No follow-up or recommendations are necessary at this time    Annual Exam Questions  Have you ever had an abnormal mammogram?: (Patient-Rptd) No  Have you received the Gardasil HPV vaccine?: (Patient-Rptd) No  Have you received a flu vaccine this year?: (Patient-Rptd) Yes  Have you received a COVID-19 vaccine?: (Patient-Rptd) Yes    Past Medical History:    Abnormal Papanicolaou smear of vagina    Back pain    Fibroids    GBS (group B streptococcus) infection    Ovarian cyst    SVD (spontaneous vaginal delivery)    Unspecified deficiency anemia    Unspecified problems with internal organs       STD History              No specialty history recorded             Surgical History:   Procedure Laterality Date  LAPAROSCOPIC CHOLECYSTECTOMY N/A 04/26/2020    Performed by Freund, Gill Lace., MD at [facility] OR       Family History   Problem Relation Name Age of Onset    High Cholesterol Mother      Hearing Loss Mother      Learning Disability Mother      Asthma Brother      Asthma Maternal Grandmother Lazarus Primer     Cancer-Lung Maternal Grandmother Lazarus Primer     Cancer Maternal Grandmother Lazarus Primer         Lung cancer    Cancer-Prostate Maternal Grandfather Lawernce Presto     Cancer Maternal Grandfather Lawernce Presto         Prostate cancer    Arthritis Neg Hx      Bleeding Disorders Neg Hx      Cancer-Colon Neg Hx      Cancer-Breast Neg Hx      Cancer-Ovarian Neg Hx      Cancer-Uterine Neg Hx      Cleft Lip Neg Hx      Cervical Cancer Neg Hx      Cleft Palate Neg Hx Diabetes Neg Hx      Heart Disease Neg Hx      Migraines Neg Hx      Miscarriages Neg Hx      Rashes/Skin Problems Neg Hx      Seizures Neg Hx      Thyroid Disease Neg Hx      VTE Neg Hx      Unknown to Patient Neg Hx      Blindness Neg Hx      Cataract Neg Hx      Glaucoma Neg Hx      Macular Degen Neg Hx      Retinal Detachment Neg Hx         Allergies  Patient has no known allergies.     Medications  Current Outpatient Medications   Medication Sig Dispense Refill    atorvastatin (LIPITOR) 40 mg tablet Take one tablet by mouth daily. Indications: excessive fat in the blood 90 tablet 0    cyclobenzaprine (FLEXERIL) 5 mg tablet Take one tablet by mouth at bedtime as needed for Muscle Cramps. Indications: muscle spasm 60 tablet 3    diclofenac sodium (VOLTAREN) 1 % topical gel Apply four grams topically to affected area four times daily. 300 g 3    fexofenadine (ALLEGRA) 60 mg tablet Take one tablet by mouth twice daily.      fluticasone propionate (FLONASE) 50 mcg/actuation nasal spray, suspension Apply one spray into nose as directed twice daily. Indications: inflammation of the nose due to an allergy 48 g 0    metFORMIN (GLUCOPHAGE) 500 mg tablet Take one tablet by mouth daily with dinner for 7 days, THEN one tablet twice daily with meals for 90 days. Indications: prevention of type 2 diabetes mellitus 187 tablet 1    simethicone (GAS RELIEF (SIMETHICONE)) 80 mg chew tablet Chew one tablet by mouth every 6 hours as needed for Flatulence. 30 tablet 0     No current facility-administered medications for this visit.       Objective:     Physical Exam  BP 124/70  - Pulse 62  - Temp (P) 36.8 ?C (98.3 ?F) (Oral)  - Ht (P) 165.1 cm (5' 5)  - Wt 85.4 kg (188 lb 3.2 oz)  - LMP 06/21/2023 (Within Days)  - BMI (P) 31.32  kg/m?     Physical Exam  Exam conducted with a chaperone present.   Constitutional:       General: She is not in acute distress.     Appearance: Normal appearance.   HENT:      Head: Normocephalic and atraumatic.   Pulmonary:      Effort: Pulmonary effort is normal.   Abdominal:      General: Abdomen is flat.      Palpations: Abdomen is soft.      Tenderness: There is no abdominal tenderness.   Genitourinary:     General: Normal vulva.      Vagina: Normal. No lesions.      Cervix: Normal. No cervical bleeding.      Uterus: Normal.       Adnexa: Right adnexa normal and left adnexa normal.   Musculoskeletal:         General: Normal range of motion.      Cervical back: Normal range of motion.   Skin:     General: Skin is warm and dry.   Neurological:      General: No focal deficit present.      Mental Status: She is alert. Mental status is at baseline.   Psychiatric:         Mood and Affect: Mood normal.         Behavior: Behavior normal.           Assessment:     48 y.o. G10P100010 here for annual gynecologic visit      Plan:     Annual gynecologic visit  Cervical cancer screening: per [facility], 3 year screening due after normal co-testing following colpo. Due 08/2024  Screening for breast cancer: done 04/2023, continue yearly screening  Contraception plan: Discussed decreased bone mineral density with Depo use for 15 years especially when she is close to menopausal age. Discussed alternative contraception options but patient would like to continue Depo-Provera at this time and accepts risk. Rx q3 months x1 year. Discussed discontinue use at average age of menopause 50yo  STD screening: declined  Screening for colon cancer if >45: cologuard 01/2022  Immunizations: flu OOS, COVID x2, booster declined, Tdap UTD  Counseling: Reviewed diet and exercise recommendations. Safe sex practices.  Breast self-awareness.    RTC annually or as needed         D/w Dr. Liston Riedel, MD  PGY-3 Obstetrics and Gynecology    Future Appointments   Date Time Provider Department Center   09/20/2023  2:10 PM Reynolds Cea, PT [facility]   09/27/2023  2:10 PM Reynolds Cea, PT [facility]   10/04/2023  2:10 PM Reynolds Cea, PT [facility]

## 2023-09-07 ENCOUNTER — Encounter: Admit: 2023-09-07 | Discharge: 2023-09-07 | Payer: PRIVATE HEALTH INSURANCE

## 2023-09-08 ENCOUNTER — Encounter: Admit: 2023-09-08 | Discharge: 2023-09-08 | Payer: PRIVATE HEALTH INSURANCE

## 2023-09-08 ENCOUNTER — Ambulatory Visit: Admit: 2023-09-08 | Discharge: 2023-09-09 | Payer: BLUE CROSS/BLUE SHIELD

## 2023-09-08 DIAGNOSIS — Z01812 Encounter for preprocedural laboratory examination: Secondary | ICD-10-CM

## 2023-09-08 DIAGNOSIS — Z3009 Encounter for other general counseling and advice on contraception: Secondary | ICD-10-CM

## 2023-09-08 DIAGNOSIS — Z01419 Encounter for gynecological examination (general) (routine) without abnormal findings: Secondary | ICD-10-CM

## 2023-09-08 MED ORDER — MEDROXYPROGESTERONE 150 MG/ML IM SYRG
150 mg | Freq: Once | INTRAMUSCULAR | 0 refills | Status: CP
Start: 2023-09-08 — End: ?

## 2023-10-05 ENCOUNTER — Encounter: Admit: 2023-10-05 | Discharge: 2023-10-05 | Payer: PRIVATE HEALTH INSURANCE

## 2023-10-09 ENCOUNTER — Encounter: Admit: 2023-10-09 | Discharge: 2023-10-09 | Payer: PRIVATE HEALTH INSURANCE

## 2023-10-09 MED FILL — ATORVASTATIN 40 MG PO TAB: 40 mg | ORAL | 90 days supply | Qty: 90 | Fill #0 | Status: AC

## 2023-11-01 ENCOUNTER — Encounter: Admit: 2023-11-01 | Discharge: 2023-11-01 | Payer: PRIVATE HEALTH INSURANCE

## 2023-11-24 ENCOUNTER — Ambulatory Visit: Admit: 2023-11-24 | Discharge: 2023-11-25 | Payer: BLUE CROSS/BLUE SHIELD

## 2023-11-24 ENCOUNTER — Encounter: Admit: 2023-11-24 | Discharge: 2023-11-24 | Payer: PRIVATE HEALTH INSURANCE

## 2023-11-24 DIAGNOSIS — Z3042 Encounter for surveillance of injectable contraceptive: Principal | ICD-10-CM

## 2023-11-24 MED ORDER — MEDROXYPROGESTERONE 150 MG/ML IM SYRG
150 mg | Freq: Once | INTRAMUSCULAR | 0 refills | Status: CP
Start: 2023-11-24 — End: ?

## 2023-11-24 NOTE — Progress Notes
 Nurse visit for depo-provera  injection. Patient's last injection was 09/08/23, patient within window for injection. Patient roomed and verified patient name and DOB for patient safety. Patient has no questions or concerns regarding injection. Medication administered without events. Patient due for next injection between October 22-November 5.

## 2024-01-07 ENCOUNTER — Encounter: Admit: 2024-01-07 | Discharge: 2024-01-07 | Payer: PRIVATE HEALTH INSURANCE

## 2024-01-08 ENCOUNTER — Encounter: Admit: 2024-01-08 | Discharge: 2024-01-08 | Payer: PRIVATE HEALTH INSURANCE

## 2024-01-09 MED FILL — ATORVASTATIN 40 MG PO TAB: 40 mg | ORAL | 90 days supply | Qty: 90 | Fill #0 | Status: AC

## 2024-02-09 ENCOUNTER — Encounter: Admit: 2024-02-09 | Discharge: 2024-02-09 | Payer: PRIVATE HEALTH INSURANCE

## 2024-02-09 ENCOUNTER — Ambulatory Visit: Admit: 2024-02-09 | Discharge: 2024-02-10 | Payer: BLUE CROSS/BLUE SHIELD

## 2024-02-09 DIAGNOSIS — Z3042 Encounter for surveillance of injectable contraceptive: Principal | ICD-10-CM

## 2024-02-09 MED ORDER — MEDROXYPROGESTERONE 150 MG/ML IM SYRG
150 mg | Freq: Once | INTRAMUSCULAR | 0 refills | Status: CP
Start: 2024-02-09 — End: ?

## 2024-02-09 NOTE — Progress Notes
 Pt came in today to receive her DepoProvera injection, 150mg  given IM. Patient tolerated injection well, no bleeding or redness at injection site.  Site: right gluteal  Lot #: [id number]  Exp Date: 05/20/2028  Next injection due: 04/26/2024  Mfg: [employer]

## 2024-02-10 ENCOUNTER — Encounter: Admit: 2024-02-10 | Discharge: 2024-02-10 | Payer: PRIVATE HEALTH INSURANCE

## 2024-04-06 ENCOUNTER — Encounter: Admit: 2024-04-06 | Discharge: 2024-04-06 | Payer: PRIVATE HEALTH INSURANCE

## 2024-04-13 ENCOUNTER — Encounter: Admit: 2024-04-13 | Discharge: 2024-04-13 | Payer: PRIVATE HEALTH INSURANCE

## 2024-04-26 ENCOUNTER — Encounter: Admit: 2024-04-26 | Discharge: 2024-04-26 | Payer: PRIVATE HEALTH INSURANCE

## 2024-04-26 ENCOUNTER — Ambulatory Visit: Admit: 2024-04-26 | Discharge: 2024-04-27 | Payer: PRIVATE HEALTH INSURANCE

## 2024-04-26 DIAGNOSIS — Z3042 Encounter for surveillance of injectable contraceptive: Principal | ICD-10-CM

## 2024-04-26 MED ORDER — MEDROXYPROGESTERONE 150 MG/ML IM SYRG
150 mg | Freq: Once | INTRAMUSCULAR | 0 refills | Status: AC
Start: 2024-04-26 — End: ?

## 2024-04-26 MED ORDER — MEDROXYPROGESTERONE 150 MG/ML IM SYRG
150 mg | Freq: Once | INTRAMUSCULAR | 0 refills | Status: CP
Start: 2024-04-26 — End: ?

## 2024-04-26 NOTE — Progress Notes [1]
 Patient arrived in clinic for Depo injection. Depo given in left gluteal. Patient tolerated injection without complication. Next due March 25 - April 8. Pt scheduled.    Future Appointments   Date Time Provider Department Center   06/23/2024  9:00 AM Stana Sober, MD [facility]   07/13/2024  9:00 AM [facility] NURSE [facility]   07/13/2024  9:40 AM Sima Donnice PARAS, OD [facility]

## 2024-05-02 ENCOUNTER — Encounter: Admit: 2024-05-02 | Discharge: 2024-05-02 | Payer: PRIVATE HEALTH INSURANCE

## 2024-05-03 ENCOUNTER — Encounter: Admit: 2024-05-03 | Discharge: 2024-05-03 | Payer: PRIVATE HEALTH INSURANCE
